# Patient Record
Sex: Female | Born: 1995 | Race: White | Hispanic: No | Marital: Married | Smoking: Never smoker
Health system: Southern US, Community
[De-identification: ages and names within clinical notes are randomized; demographics above are authoritative.]

## PROBLEM LIST (undated history)

## (undated) HISTORY — PX: FRACTURE SURGERY: SHX138

---

## 2017-03-27 DIAGNOSIS — M9902 Segmental and somatic dysfunction of thoracic region: Secondary | ICD-10-CM | POA: Diagnosis not present

## 2017-03-27 DIAGNOSIS — M9901 Segmental and somatic dysfunction of cervical region: Secondary | ICD-10-CM | POA: Diagnosis not present

## 2017-03-27 DIAGNOSIS — M9903 Segmental and somatic dysfunction of lumbar region: Secondary | ICD-10-CM | POA: Diagnosis not present

## 2017-03-27 DIAGNOSIS — M5412 Radiculopathy, cervical region: Secondary | ICD-10-CM | POA: Diagnosis not present

## 2017-03-28 DIAGNOSIS — M9901 Segmental and somatic dysfunction of cervical region: Secondary | ICD-10-CM | POA: Diagnosis not present

## 2017-03-28 DIAGNOSIS — M9903 Segmental and somatic dysfunction of lumbar region: Secondary | ICD-10-CM | POA: Diagnosis not present

## 2017-03-28 DIAGNOSIS — M5412 Radiculopathy, cervical region: Secondary | ICD-10-CM | POA: Diagnosis not present

## 2017-03-28 DIAGNOSIS — M9902 Segmental and somatic dysfunction of thoracic region: Secondary | ICD-10-CM | POA: Diagnosis not present

## 2017-03-29 DIAGNOSIS — M5412 Radiculopathy, cervical region: Secondary | ICD-10-CM | POA: Diagnosis not present

## 2017-03-29 DIAGNOSIS — M9902 Segmental and somatic dysfunction of thoracic region: Secondary | ICD-10-CM | POA: Diagnosis not present

## 2017-03-29 DIAGNOSIS — M9901 Segmental and somatic dysfunction of cervical region: Secondary | ICD-10-CM | POA: Diagnosis not present

## 2017-03-29 DIAGNOSIS — M9903 Segmental and somatic dysfunction of lumbar region: Secondary | ICD-10-CM | POA: Diagnosis not present

## 2017-07-09 ENCOUNTER — Encounter (INDEPENDENT_AMBULATORY_CARE_PROVIDER_SITE_OTHER): Payer: Self-pay

## 2017-07-09 ENCOUNTER — Encounter: Payer: Self-pay | Admitting: Family Medicine

## 2017-07-09 ENCOUNTER — Ambulatory Visit: Payer: BLUE CROSS/BLUE SHIELD | Admitting: Family Medicine

## 2017-07-09 VITALS — BP 108/68 | HR 62 | Ht 63.0 in | Wt 113.0 lb

## 2017-07-09 DIAGNOSIS — N941 Unspecified dyspareunia: Secondary | ICD-10-CM | POA: Diagnosis not present

## 2017-07-09 DIAGNOSIS — M9903 Segmental and somatic dysfunction of lumbar region: Secondary | ICD-10-CM | POA: Diagnosis not present

## 2017-07-09 DIAGNOSIS — Z23 Encounter for immunization: Secondary | ICD-10-CM | POA: Diagnosis not present

## 2017-07-09 DIAGNOSIS — M9902 Segmental and somatic dysfunction of thoracic region: Secondary | ICD-10-CM | POA: Diagnosis not present

## 2017-07-09 DIAGNOSIS — Z30011 Encounter for initial prescription of contraceptive pills: Secondary | ICD-10-CM

## 2017-07-09 DIAGNOSIS — M9901 Segmental and somatic dysfunction of cervical region: Secondary | ICD-10-CM | POA: Diagnosis not present

## 2017-07-09 DIAGNOSIS — M5412 Radiculopathy, cervical region: Secondary | ICD-10-CM | POA: Diagnosis not present

## 2017-07-09 MED ORDER — NORGESTIMATE-ETH ESTRADIOL 0.25-35 MG-MCG PO TABS: 1.0000 | ORAL_TABLET | Freq: Every day | ORAL | 11 refills | Status: DC

## 2017-07-09 MED ORDER — LIDOCAINE 5 % EX OINT: 1.0000 "application " | TOPICAL_OINTMENT | CUTANEOUS | 1 refills | Status: AC | PRN

## 2017-07-09 NOTE — Patient Instructions (Signed)
Thank you for coming in today. Get me the labs from the life insurance.  Start sprintec birth control.  Use the lidocaine ointment on the outside and little on the inside before sex to help with pain.  Remember to go slowly and use lots of water based lube.  The birth control should be effective in a few days.   We should be a pap smear around 22 years old.   We should get records from your pediatric about vaccines.

## 2017-07-09 NOTE — Progress Notes (Signed)
Brittney Lozano is a 22 y.o. female who presents to [facility]: Primary Care Sports Medicine today for establish care.   Brittney Lozano is a healthy 22 year old woman who does not have any known medical problems.  She is establishing care today for some base line screening tests for her family history of heart disease and diabetes and thyroid disease.  Additionally she is interested in birth control options and notes that she has some pain when she has sex.  Family history is pertinent for early cardiac disease, diabetes, and thyroid disease.  She has a scattered health history across multiple states in her youth she is not entirely sure about her vaccine history.  She is interested in tetanus vaccine today but not interested in influenza vaccine.  She notes that she had labs recently as part of her life insurance application thinks that she already has had HIV screening and cholesterol checked.   Pregnancy prevention: Brittney Lozano is recently married to her husband.  They work together on a horse farm.  She does not want to become pregnant.  She is currently using condoms to prevent pregnancy.  She is very anxious about accidentally becoming pregnant.  She has done some research and reading on her birth control options and would like to use the birth control pill at this time.  She is confident that she can take it regularly.  Painful Sex: Brittney Lozano notes that she tends to have pain at the beginning and during sex.  She has tried using lubrication which helps a little.  She denies any fevers or chills vomiting or diarrhea or vaginal discharge.  History reviewed. No pertinent past medical history. History reviewed. No pertinent surgical history. Social History   Tobacco Use  . Smoking status: Never Smoker  . Smokeless tobacco: Never Used  Substance Use Topics  . Alcohol use: No    Frequency: Never   family history  includes Diabetes in her brother; Heart disease in her father and mother; Hypertension in her mother.  ROS as above: No headache, visual changes, nausea, vomiting, diarrhea, constipation, dizziness, abdominal pain, skin rash, fevers, chills, night sweats, weight loss, swollen lymph nodes, body aches, joint swelling, muscle aches, chest pain, shortness of breath, mood changes, visual or auditory hallucinations.    Medications: Current Outpatient Medications  Medication Sig Dispense Refill  . lidocaine (XYLOCAINE) 5 % ointment Apply 1 application topically as needed. 50 g 1  . norgestimate-ethinyl estradiol (ORTHO-CYCLEN,SPRINTEC,PREVIFEM) 0.25-35 MG-MCG tablet Take 1 tablet by mouth daily. Use as directed. 1 Package 11   No current facility-administered medications for this visit.    No Known Allergies  Health Maintenance Health Maintenance  Topic Date Due  . CHLAMYDIA SCREENING  11/27/2011  . HIV Screening  11/27/2011  . TETANUS/TDAP  11/27/2015  . INFLUENZA VACCINE  03/11/2018 (Originally 01/31/2017)     Exam:  BP 108/68   Pulse 62   Ht 5\' 3"  (1.6 m)   Wt 113 lb (51.3 kg)   BMI 20.02 kg/m  Gen: Well NAD HEENT: EOMI,  MMM Lungs: Normal work of breathing. CTABL Heart: RRR no MRG Abd: NABS, Soft. Nondistended, Nontender Exts: Brisk capillary refill, warm and well perfused.    No results found for this or any previous visit (from the past 72 hour(s)). No results found.    Assessment and Plan: 22 y.o. female with  Dyspareunia: Likely spasm in the muscles surrounding the introitus during sex.  I spent a great  deal of time discussing with Brittney Lozano and her husband about the importance of using lubrication has been slow and communicating well during sex.  Additionally we discussed using a little bit of lidocaine ointment of sex to help with sensitivity and spasm.  I noted that sex therapy should be very helpful in symptoms that sex is usually avoidable and sometimes she does not  have to live with.   Contraception: Discussed options.  Plan for Sprintec birth control pills.  Condoms for backup are reasonable.   Screening: We will get labs from recent physical.  Will add TSH lipids metabolic panel as needed. Pap smear due at 22 years old.  Tdap given today prior to discharge.   Orders Placed This Encounter  Procedures  . Tdap vaccine greater than or equal to 7yo IM   Meds ordered this encounter  Medications  . norgestimate-ethinyl estradiol (ORTHO-CYCLEN,SPRINTEC,PREVIFEM) 0.25-35 MG-MCG tablet    Sig: Take 1 tablet by mouth daily. Use as directed.    Dispense:  1 Package    Refill:  11  . lidocaine (XYLOCAINE) 5 % ointment    Sig: Apply 1 application topically as needed.    Dispense:  50 g    Refill:  1     Discussed warning signs or symptoms. Please see discharge instructions. Patient expresses understanding.

## 2017-07-19 ENCOUNTER — Telehealth: Payer: Self-pay

## 2017-07-19 NOTE — Telephone Encounter (Signed)
Patient called stated that after 2 weeks of starting sprintec she has been experiencing jittery and heart racing. She wants to know if this is to expected with this medication or should she come in. Please advise. Brittney Lozano,CMA

## 2017-07-20 NOTE — Telephone Encounter (Signed)
Those are not expected symptoms of birth control pills. We can investigate it further. Follow up with me or if I cant get you in (I am out of town starting Wednesday) my partners can see you.

## 2017-07-20 NOTE — Telephone Encounter (Signed)
Called patient to give advise but was unable to reach the patient. I will try back later. Rhonda Cunningham,CMA

## 2017-07-23 NOTE — Telephone Encounter (Signed)
Called patient  And she stated that she is not currently having those symptoms and she is still taking the pills but advised patient if symptoms do come back to call the office and schedule an appointment. Rhonda Cunningham,CMA

## 2017-10-09 DIAGNOSIS — M5412 Radiculopathy, cervical region: Secondary | ICD-10-CM | POA: Diagnosis not present

## 2017-10-09 DIAGNOSIS — M9903 Segmental and somatic dysfunction of lumbar region: Secondary | ICD-10-CM | POA: Diagnosis not present

## 2017-10-09 DIAGNOSIS — M9902 Segmental and somatic dysfunction of thoracic region: Secondary | ICD-10-CM | POA: Diagnosis not present

## 2017-10-09 DIAGNOSIS — M9901 Segmental and somatic dysfunction of cervical region: Secondary | ICD-10-CM | POA: Diagnosis not present

## 2017-11-06 DIAGNOSIS — M9902 Segmental and somatic dysfunction of thoracic region: Secondary | ICD-10-CM | POA: Diagnosis not present

## 2017-11-06 DIAGNOSIS — M9901 Segmental and somatic dysfunction of cervical region: Secondary | ICD-10-CM | POA: Diagnosis not present

## 2017-11-06 DIAGNOSIS — M5412 Radiculopathy, cervical region: Secondary | ICD-10-CM | POA: Diagnosis not present

## 2017-11-06 DIAGNOSIS — M9903 Segmental and somatic dysfunction of lumbar region: Secondary | ICD-10-CM | POA: Diagnosis not present

## 2017-12-11 ENCOUNTER — Encounter: Payer: Self-pay | Admitting: Family Medicine

## 2017-12-11 ENCOUNTER — Ambulatory Visit: Payer: BLUE CROSS/BLUE SHIELD | Admitting: Family Medicine

## 2017-12-11 ENCOUNTER — Ambulatory Visit (INDEPENDENT_AMBULATORY_CARE_PROVIDER_SITE_OTHER): Payer: BLUE CROSS/BLUE SHIELD

## 2017-12-11 VITALS — BP 91/60 | HR 60 | Wt 117.0 lb

## 2017-12-11 DIAGNOSIS — M7989 Other specified soft tissue disorders: Secondary | ICD-10-CM | POA: Diagnosis not present

## 2017-12-11 DIAGNOSIS — S59911A Unspecified injury of right forearm, initial encounter: Secondary | ICD-10-CM | POA: Diagnosis not present

## 2017-12-11 DIAGNOSIS — W5512XA Struck by horse, initial encounter: Secondary | ICD-10-CM

## 2017-12-11 DIAGNOSIS — M79631 Pain in right forearm: Secondary | ICD-10-CM

## 2017-12-11 NOTE — Patient Instructions (Signed)
Thank you for coming in today. Keep working on stretching.  Ok to ride if feeling ok with the brace.  Use the brace as needed.  Compression is ok.  Keep an eye on pain and numbness to the top of the hand.    Acute Compartment Syndrome Compartment syndrome is a painful condition that occurs when swelling and pressure build up in a body space (compartment) of the arms or legs. Groups of muscles, nerves, and blood vessels in the arms and legs are separated into various compartments. Each compartment is surrounded by tough layers of tissue (fascia). In compartment syndrome, pressure builds up within the layers of fascia and begins to push on the structures within that compartment. In acute compartment syndrome, the pressure builds up suddenly, often as the result of an injury. If pressure continues to increase, it can block the flow of blood in the smallest blood vessels (capillaries). Then the muscles in the compartment cannot get enough oxygen and nutrients and will start to die within 4-6 hours. The nerves will begin to die within 12-24 hours. This condition is a medical emergency that must be treated with surgery. What are the causes? This condition may be caused by:  Injury. Some injuries can cause swelling or bleeding in a compartment. This can lead to compartment syndrome. Injuries that may cause this problem include: ? Broken bones, especially the long bones of the arms and legs. ? Crushing injuries. ? Penetrating injuries, such as a knife wound. ? Badly bruised muscles. ? Poisonous bites, such as a snake bite. ? Severe burns.  Blocked blood flow. This could be a result of: ? A cast or bandage that is too tight. ? A surgical procedure. Blood flow sometimes has to be stopped for a while during a surgery, usually with a tourniquet. ? Lying for too long in a position that restricts blood flow. This can happen in people who have nerve damage or if a person is unconscious for a long  time. ? Medicines used to build up muscles (anabolic steroids). ? Medicines that keep the blood from forming clots (blood thinners).  What are the signs or symptoms? The most common symptom of this condition is pain. The pain:  May be far more severe than it should be for the injury you have.  May get worse: ? When moving or stretching the affected body part. ? When the area is pushed or squeezed. ? When raising (elevating) affected body part above the level of the heart.  May come with a feeling of tingling or burning.  May not get better when you take pain medicine.  Other symptoms include:  A feeling of tightness or fullness in the affected area.  A loss of feeling.  Weakness in the area.  Loss of movement.  Skin becoming pale, tight, and shiny over the painful area.  Warmth and tenderness.  Tensing when the affected area is touched.  How is this diagnosed? This condition may be diagnosed based on:  Your physical exam and symptoms.  Measuring the pressure in the affected area (compartment pressure measurement).  Tests to rule out other problems, such as: ? X-rays. ? Blood tests. ? Ultrasound.  How is this treated? Treatment for this condition uses a procedure called fasciotomy. In this procedure, incisions are made through the fascia to relieve the pressure in the compartment and to prevent permanent damage. Before the surgery, first-aid treatment is done, which may include:  Treating any injury.  Loosening or removing any  cast, bandage, or external wrap that may be causing pain.  Elevating the painful arm or leg to the same level as the heart.  Giving oxygen.  Giving fluids through an IV tube.  Pain medicine.  Summary  Compartment syndrome occurs when swelling and pressure build up in a body space (compartment) of the arms or legs.  First aid treatment may include loosening or removing a cast, bandage, or wrap and elevating the painful arm or leg  at the level of the heart.  In acute compartment syndrome, the pressure builds up suddenly, often as the result of an injury.  This condition is a medical emergency that must be treated with a surgical procedure called fasciotomy. This procedure relieves the pressure and prevents permanent damage. This information is not intended to replace advice given to you by your health care provider. Make sure you discuss any questions you have with your health care provider. Document Released: 06/07/2009 Document Revised: 06/08/2016 Document Reviewed: 06/08/2016 Elsevier Interactive Patient Education  2017 [employer]

## 2017-12-11 NOTE — Progress Notes (Signed)
Brittney DuffKatlyn Lozano is a 22 y.o. female who presents to [facility] today for right forearm pain.   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 She notes pain and bruising and swelling in the right dorsal to ulnar forearm. She notes some mild numbness and tingling to the fifth digit and the ulnar side of the fourth digit.  She denies any weakness or numbness.  She notes pain is present with wrist and finger extension.  She denies any weakness.  She is tried some ibuprofen which does help some.  She has a bit of a racing event this weekend and a very important bill racing event next week and that she would like to attend if possible.  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    ROS:  As above  Exam:  BP 91/60   Pulse 60   Wt 117 lb (53.1 kg)   BMI 20.73 kg/m  General: Well Developed, well nourished, and in no acute distress.  Neuro/Psych: Alert and oriented x3, extra-ocular muscles intact, able to move all 4 extremities, sensation grossly intact. Skin: Warm and dry, no rashes noted.  Respiratory: Not using accessory muscles, speaking in full sentences, trachea midline.  Cardiovascular: Pulses palpable, no extremity edema. Abdomen: Does not appear distended. MSK:  Left forearm large crescent-shaped contusion across the dorsal ulnar forearm.  This is associated with forearm swelling.  Region is tender to palpation. Elbow motion is intact. Wrist motion is intact but pain with wrist flexion and resisted wrist extension and finger extension Pulses capillary refill and sensation or strength. Hand strength is intact to finger extension flexion abduction and adduction.    Lab and Radiology Results X-ray images right forearm personally independent reviewed. No fractures present.  No acute  changes. Awaiting formal radiology review.  Assessment and Plan: 22 y.o. female with  Right forearm contusion.  No fractures.  No obvious severe injury.  Acute compartment syndrome is extremely unlikely as patient has intact sensation pulses capillary refill and strength. She does have symptoms of contusion to the extensor muscle groups in her forearm.  Plan to use a wrist brace as needed for comfort as well as working on wrist flexion activities. The subjective numbness and tingling to the fifth and fourth digit are very likely ulnar nerve irritation due to swelling.  This should improve his symptoms improve. Additionally will discuss possibility of developing compartment syndrome and discussed warning signs or symptoms.  I think compartment syndrome is very unlikely as patient does not have symptoms in the nerve distribution associated with dorsal compartment syndrome.  Tdap up to date.   Orders Placed This Encounter  Procedures  . DG Forearm Right    Standing Status:   Future    Number of Occurrences:   1    Standing Expiration Date:   02/11/2019    Order Specific Question:   Reason for Exam (SYMPTOM  OR DIAGNOSIS REQUIRED)    Answer:   kicked by horse pain in forearm    Order Specific Question:   Is patient pregnant?    Answer:   No    Order Specific Question:   Preferred imaging location?    [facility]    Order Specific Question:   Radiology Contrast Protocol - do NOT  remove file path    Answer:   [email or web address]   No orders of the defined types were placed in this encounter.   Historical information moved to improve visibility of documentation.  History reviewed. No pertinent past medical history. History reviewed. No pertinent surgical history. Social History   Tobacco Use  . Smoking status: Never Smoker  . Smokeless tobacco: Never Used  Substance Use Topics  . Alcohol use: No    Frequency: Never   family  history includes Diabetes in her brother; Heart disease in her father and mother; Hypertension in her mother.  Medications: Current Outpatient Medications  Medication Sig Dispense Refill  . lidocaine (XYLOCAINE) 5 % ointment Apply 1 application topically as needed. 50 g 1  . norgestimate-ethinyl estradiol (ORTHO-CYCLEN,SPRINTEC,PREVIFEM) 0.25-35 MG-MCG tablet Take 1 tablet by mouth daily. Use as directed. 1 Package 11   No current facility-administered medications for this visit.    No Known Allergies    Discussed warning signs or symptoms. Please see discharge instructions. Patient expresses understanding.

## 2018-03-01 ENCOUNTER — Telehealth: Payer: Self-pay | Admitting: Family Medicine

## 2018-03-01 NOTE — Telephone Encounter (Signed)
You are overdue for cervical cancer and chlamydia screening.  This is part of typical well woman care.  I can do these tests however if you would like a woman to do them we can arrange for Dr. Sunnie NielsenNatalie Alexander to do the Pap smear.  If you want an OB/GYN provider to do one I can refer you to OB/GYN.  Please let me know what you would like

## 2018-03-01 NOTE — Telephone Encounter (Signed)
Please schedule

## 2018-03-05 NOTE — Telephone Encounter (Signed)
Unable to get to this on Friday, can you please call and try to schedule pt?  Thanks!

## 2018-03-07 NOTE — Telephone Encounter (Signed)
I spoke to patient, she's at the vet right now with her horse and will call us back when she's done with that appointment.

## 2018-03-19 ENCOUNTER — Emergency Department (HOSPITAL_COMMUNITY)
Admission: EM | Admit: 2018-03-19 | Discharge: 2018-03-19 | Disposition: A | Discharge status: Home / Self Care | Payer: BLUE CROSS/BLUE SHIELD | Attending: Emergency Medicine | Admitting: Emergency Medicine

## 2018-03-19 ENCOUNTER — Other Ambulatory Visit: Payer: Self-pay

## 2018-03-19 ENCOUNTER — Encounter (HOSPITAL_COMMUNITY): Payer: Self-pay | Admitting: Emergency Medicine

## 2018-03-19 ENCOUNTER — Emergency Department (HOSPITAL_COMMUNITY): Payer: BLUE CROSS/BLUE SHIELD

## 2018-03-19 DIAGNOSIS — R55 Syncope and collapse: Secondary | ICD-10-CM | POA: Diagnosis not present

## 2018-03-19 DIAGNOSIS — Z3202 Encounter for pregnancy test, result negative: Secondary | ICD-10-CM | POA: Diagnosis not present

## 2018-03-19 DIAGNOSIS — R000 Tachycardia, unspecified: Secondary | ICD-10-CM | POA: Diagnosis not present

## 2018-03-19 DIAGNOSIS — R202 Paresthesia of skin: Secondary | ICD-10-CM | POA: Diagnosis not present

## 2018-03-19 DIAGNOSIS — R0689 Other abnormalities of breathing: Secondary | ICD-10-CM | POA: Diagnosis not present

## 2018-03-19 LAB — COMPREHENSIVE METABOLIC PANEL
ALT: 13 U/L (ref 0–44)
ANION GAP: 7 (ref 5–15)
AST: 17 U/L (ref 15–41)
Albumin: 3.9 g/dL (ref 3.5–5.0)
Alkaline Phosphatase: 39 U/L (ref 38–126)
BUN: 14 mg/dL (ref 6–20)
CO2: 22 mmol/L (ref 22–32)
CREATININE: 0.97 mg/dL (ref 0.44–1.00)
Calcium: 8.7 mg/dL — ABNORMAL LOW (ref 8.9–10.3)
Chloride: 108 mmol/L (ref 98–111)
Glucose, Bld: 106 mg/dL — ABNORMAL HIGH (ref 70–99)
Potassium: 3.6 mmol/L (ref 3.5–5.1)
SODIUM: 137 mmol/L (ref 135–145)
Total Bilirubin: 0.5 mg/dL (ref 0.3–1.2)
Total Protein: 7 g/dL (ref 6.5–8.1)

## 2018-03-19 LAB — URINALYSIS, ROUTINE W REFLEX MICROSCOPIC
BACTERIA UA: NONE SEEN
Bilirubin Urine: NEGATIVE
GLUCOSE, UA: NEGATIVE mg/dL
Hgb urine dipstick: NEGATIVE
KETONES UR: NEGATIVE mg/dL
Leukocytes, UA: NEGATIVE
NITRITE: NEGATIVE
PROTEIN: 30 mg/dL — AB
Specific Gravity, Urine: 1.023 (ref 1.005–1.030)
pH: 9 — ABNORMAL HIGH (ref 5.0–8.0)

## 2018-03-19 LAB — CBC
HCT: 36 % (ref 36.0–46.0)
Hemoglobin: 12.1 g/dL (ref 12.0–15.0)
MCH: 32.1 pg (ref 26.0–34.0)
MCHC: 33.6 g/dL (ref 30.0–36.0)
MCV: 95.5 fL (ref 78.0–100.0)
Platelets: 177 10*3/uL (ref 150–400)
RBC: 3.77 MIL/uL — AB (ref 3.87–5.11)
RDW: 11.6 % (ref 11.5–15.5)
WBC: 7.6 10*3/uL (ref 4.0–10.5)

## 2018-03-19 LAB — PREGNANCY, URINE: Preg Test, Ur: NEGATIVE

## 2018-03-19 LAB — CBG MONITORING, ED: GLUCOSE-CAPILLARY: 90 mg/dL (ref 70–99)

## 2018-03-19 MED ORDER — SODIUM CHLORIDE 0.9 % IV BOLUS
1000.0000 mL | Freq: Once | INTRAVENOUS | Status: AC
  Administered 2018-03-19: 1000 mL via INTRAVENOUS

## 2018-03-19 NOTE — ED Notes (Signed)
Patient stated she doesn't eat because she's scared she will gain weight. Educated patient on importance of eating and drinking appropriately.
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
## 2018-03-19 NOTE — ED Provider Notes (Signed)
[facility] EMERGENCY DEPARTMENT Provider Note   CSN: [id number] Arrival date & time: 03/19/18  2003     History   Chief Complaint Chief Complaint  Patient presents with  . Loss of Consciousness    HPI Brittney Lozano is a 22 y.o. female.  HPI  22 year old female, she presents by ambulance after having what appeared to be a syncopal episode of sorts.  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  She has had an episode this evening where she was becoming lightheaded, she went up to the house to get something to drink, and was very little amount of Dr. Reino KentPepper, went back down to the arena where she was helping with riding lessons, it finished, she was helping the person get off the horse, she became even more lightheaded and her husband was at the side, she is somewhat collapsed to her knees and appeared to be passing out, he helped to get her back up to the house on the ATV, called 911 for assistance.  At this time she was complaining of things such as being unable to speak when she wanted to speak, having some left-sided numbness of her arm and her leg, this all resolved very quickly in the ambulance.  The patient states that she has not had headaches, she recently had eyeglasses that were fixed for her and this is eliminated her headaches.  There is no family history of multiple sclerosis, the patient does report having some intermittent tingling of her right hand but there is only after having her elbow injured.  No other complaints including chest pain shortness of breath fevers or chills but she does have some frequent urination, unsure if she is pregnant.  History reviewed. No pertinent past medical history.  There are no active problems to display for this patient.   Past Surgical History:  Procedure Laterality Date  .  FRACTURE SURGERY       OB History   None      Home Medications    Prior to Admission medications   Medication Sig Start Date End Date Taking? Authorizing Provider  lidocaine (XYLOCAINE) 5 % ointment Apply 1 application topically as needed. 07/09/17   Rodolph Bongorey, Evan S, MD  norgestimate-ethinyl estradiol (ORTHO-CYCLEN,SPRINTEC,PREVIFEM) 0.25-35 MG-MCG tablet Take 1 tablet by mouth daily. Use as directed. 07/09/17   Rodolph Bongorey, Evan S, MD    Family History Family History  Problem Relation Age of Onset  . Hypertension Mother   . Heart disease Mother   . Heart disease Father   . Diabetes Brother     Social History Social History   Tobacco Use  . Smoking status: Never Smoker  . Smokeless tobacco: Never Used  Substance Use Topics  . Alcohol use: No    Frequency: Never  . Drug use: No     Allergies   Patient has no known allergies.   Review of Systems Review of Systems  All other systems reviewed and are negative.    Physical Exam Updated Vital Signs BP 106/71   Pulse 66   Temp (!) 97.4 F (36.3 C) (Oral)   Resp 15   LMP 03/14/2018   SpO2 100%   Physical Exam  Constitutional: She appears well-developed and well-nourished. No distress.  HENT:  Head: Normocephalic and atraumatic.  Mouth/Throat: Oropharynx is  clear and moist. No oropharyngeal exudate.  Eyes: Pupils are equal, round, and reactive to light. Conjunctivae and EOM are normal. Right eye exhibits no discharge. Left eye exhibits no discharge. No scleral icterus.  Neck: Normal range of motion. Neck supple. No JVD present. No thyromegaly present.  Cardiovascular: Normal rate, regular rhythm, normal heart sounds and intact distal pulses. Exam reveals no gallop and no friction rub.  No murmur heard. Pulmonary/Chest: Effort normal and breath sounds normal. No respiratory distress. She has no wheezes. She has no rales.  Abdominal: Soft. Bowel sounds are normal. She exhibits no distension and no mass. There is no  tenderness.  Musculoskeletal: Normal range of motion. She exhibits no edema or tenderness.  Lymphadenopathy:    She has no cervical adenopathy.  Neurological: She is alert. Coordination normal.  The patient has normal speech, normal facial symmetry, normal sensation to light touch of her face arms and legs, normal coordination by finger-nose-finger, normal strength in all 4 extremities as tested by grip and straight leg raise.  Memory is intact, speech is clear.  Alternating movements are normal, no pronator drift  Skin: Skin is warm and dry. No rash noted. No erythema.  Psychiatric: She has a normal mood and affect. Her behavior is normal.  Nursing note and vitals reviewed.    ED Treatments / Results  Labs (all labs ordered are listed, but only abnormal results are displayed) Labs Reviewed  COMPREHENSIVE METABOLIC PANEL - Abnormal; Notable for the following components:      Result Value   Glucose, Bld 106 (*)    Calcium 8.7 (*)    All other components within normal limits  URINALYSIS, ROUTINE W REFLEX MICROSCOPIC - Abnormal; Notable for the following components:   APPearance HAZY (*)    pH 9.0 (*)    Protein, ur 30 (*)    All other components within normal limits  CBC - Abnormal; Notable for the following components:   RBC 3.77 (*)    All other components within normal limits  PREGNANCY, URINE  CBG MONITORING, ED    EKG EKG Interpretation  Date/Time:  Tuesday March 19 2018 20:39:10 EDT Ventricular Rate:  71 PR Interval:    QRS Duration: 90 QT Interval:  429 QTC Calculation: 467 R Axis:   79 Text Interpretation:  Sinus rhythm Normal ECG No old tracing to compare Confirmed by Eber Hong ([id number]) on 03/19/2018 8:43:00 PM   Radiology Ct Head Wo Contrast  Result Date: 03/19/2018 CLINICAL DATA:  LOC, syncope EXAM: CT HEAD WITHOUT CONTRAST TECHNIQUE: Contiguous axial images were obtained from the base of the skull through the vertex without intravenous contrast.  COMPARISON:  None. FINDINGS: Brain: No evidence of acute infarction, hemorrhage, hydrocephalus, extra-axial collection or mass lesion/mass effect. Vascular: No hyperdense vessel or unexpected calcification. Skull: Normal. Negative for fracture or focal lesion. Sinuses/Orbits: No acute finding. Other: None IMPRESSION: Negative non contrasted CT appearance of the brain Electronically Signed   By: Jasmine Pang M.D.   On: 03/19/2018 21:23    Procedures Procedures (including critical care time)  Medications Ordered in ED Medications  sodium chloride 0.9 % bolus 1,000 mL (1,000 mLs Intravenous New Bag/Given 03/19/18 2052)     Initial Impression / Assessment and Plan / ED Course  I have reviewed the triage vital signs and the nursing notes.  Pertinent labs & imaging results that were available during my care of the patient were reviewed by me and considered in my medical decision making (see chart for  details).  Clinical Course as of Mar 19 2150  Tue Mar 19, 2018  2147 The patient does have some proteinuria, she has no signs of urinary infection, she is not pregnant, metabolic panel is unremarkable and CBC is normal.  The patient has a normal CT scan of the brain, her EKG is unremarkable with no signs of accessory pathways, no signs of Brugada, the patient has been given these results, she has been given IV fluids and is now feeling much better, she is stable for discharge with normal vital signs.   [BM]    Clinical Course User Index [BM] Eber Hong, MD    The patient's exam is unremarkable including her cardiac and neurologic exams.  At this time she will need some labs to further evaluate the source of her symptoms including a urinalysis, pregnancy test, metabolic panel, CBC to rule out things such as anemia or renal dysfunction dehydration electro light abnormalities or infection.  EKG will be ordered to rule out arrhythmia or other signs of predisposing cardiac dysrhythmia such as WPW or  Brugada.  Vitals:   03/19/18 2004 03/19/18 2100 03/19/18 2130 03/19/18 2145  BP: 110/70 111/76 106/71   Pulse: 69 71 72 66  Resp: 18 17 (!) 21 15  Temp: (!) 97.4 F (36.3 C)     TempSrc: Oral     SpO2: 100% 100% 100% 100%     Final Clinical Impressions(s) / ED Diagnoses   Final diagnoses:  Syncope and collapse    ED Discharge Orders    None       Eber Hong, MD 03/19/18 2151

## 2018-03-19 NOTE — Discharge Instructions (Signed)
Your testing shows that you are dehydrated You do not have any lab problems - your blood counts are normal, your kidneys are normal Your brain looks Great on the CT scan and your heart has a normal EKG Your testing today shows that the reason that you have had this episode tonight of weakness / numbness / etc is likely from lack of nutrition and dehydration.   Please make a concerted effort to have multiple small meals a day, you should force herself to eat at least 1500 cal/day, drinking plenty of liquids and keeping your urine a nice clear watery appearance.  If you should develop severe or worsening symptoms please seek medical exam.  If you continue to have difficulty with weakness numbness or tingling he will need further evaluation for other causes including an MRI of the brain however at this time we do not need that test.

## 2018-03-21 ENCOUNTER — Ambulatory Visit: Payer: BLUE CROSS/BLUE SHIELD | Admitting: Family Medicine

## 2018-03-21 ENCOUNTER — Encounter: Payer: Self-pay | Admitting: Family Medicine

## 2018-03-21 VITALS — BP 112/75 | HR 54 | Ht 63.0 in | Wt 121.0 lb

## 2018-03-21 DIAGNOSIS — I951 Orthostatic hypotension: Secondary | ICD-10-CM | POA: Diagnosis not present

## 2018-03-21 NOTE — Patient Instructions (Addendum)
Thank you for coming in today. Keep hydrated with exercise.  Continue to eat around 1500 calories a day more with activity.  Eat good quality food.  Get some salt during the day.  Recheck in about 1 month.  In that 1 month follow up we will recheck urine and we can get blood.     Orthostatic Hypotension Orthostatic hypotension is a sudden drop in blood pressure that happens when you quickly change positions, such as when you get up from a seated or lying position. Blood pressure is a measurement of how strongly, or weakly, your blood is pressing against the walls of your arteries. Arteries are blood vessels that carry blood from your heart throughout your body. When blood pressure is too low, you may not get enough blood to your brain or to the rest of your organs. This can cause weakness, light-headedness, rapid heartbeat, and fainting. This can last for just a few seconds or for up to a few minutes. Orthostatic hypotension is usually not a serious problem. However, if it happens frequently or gets worse, it may be a sign of something more serious. What are the causes? This condition may be caused by:  Sudden changes in posture, such as standing up quickly after you have been sitting or lying down.  Blood loss.  Loss of body fluids (dehydration).  Heart problems.  Hormone (endocrine) problems.  Pregnancy.  Severe infection.  Lack of certain nutrients.  Severe allergic reactions (anaphylaxis).  Certain medicines, such as blood pressure medicine or medicines that make the body lose excess fluids (diuretics). Sometimes, this condition can be caused by not taking medicine as directed, such as taking too much of a certain medicine.  What increases the risk? Certain factors can make you more likely to develop orthostatic hypotension, including:  Age. Risk increases as you get older.  Conditions that affect the heart or the central nervous system.  Taking certain medicines, such  as blood pressure medicine or diuretics.  Being pregnant.  What are the signs or symptoms? Symptoms of this condition may include:  Weakness.  Light-headedness.  Dizziness.  Blurred vision.  Fatigue.  Rapid heartbeat.  Fainting, in severe cases.  How is this diagnosed? This condition is diagnosed based on:  Your medical history.  Your symptoms.  Your blood pressure measurement. Your health care provider will check your blood pressure when you are: ? Lying down. ? Sitting. ? Standing.  A blood pressure reading is recorded as two numbers, such as "120 over 80" (or 120/80). The first ("top") number is called the systolic pressure. It is a measure of the pressure in your arteries as your heart beats. The second ("bottom") number is called the diastolic pressure. It is a measure of the pressure in your arteries when your heart relaxes between beats. Blood pressure is measured in a unit called mm Hg. Healthy blood pressure for adults is 120/80. If your blood pressure is below 90/60, you may be diagnosed with hypotension. Other information or tests that may be used to diagnose orthostatic hypotension include:  Your other vital signs, such as your heart rate and temperature.  Blood tests.  Tilt table test. For this test, you will be safely secured to a table that moves you from a lying position to an upright position. Your heart rhythm and blood pressure will be monitored during the test.  How is this treated? Treatment for this condition may include:  Changing your diet. This may involve eating more salt (sodium)  or drinking more water.  Taking medicines to raise your blood pressure.  Changing the dosage of certain medicines you are taking that might be lowering your blood pressure.  Wearing compression stockings. These stockings help to prevent blood clots and reduce swelling in your legs.  In some cases, you may need to go to the hospital for:  Fluid replacement.  This means you will receive fluids through an IV tube.  Blood replacement. This means you will receive donated blood through an IV tube (transfusion).  Treating an infection or heart problems, if this applies.  Monitoring. You may need to be monitored while medicines that you are taking wear off.  Follow these instructions at home: Eating and drinking   Drink enough fluid to keep your urine clear or pale yellow.  Eat a healthy diet and follow instructions from your health care provider about eating or drinking restrictions. A healthy diet includes: ? Fresh fruits and vegetables. ? Whole grains. ? Lean meats. ? Low-fat dairy products.  Eat extra salt only as directed. Do not add extra salt to your diet unless your health care provider told you to do that.  Eat frequent, small meals.  Avoid standing up suddenly after eating. Medicines  Take over-the-counter and prescription medicines only as told by your health care provider. ? Follow instructions from your health care provider about changing the dosage of your current medicines, if this applies. ? Do not stop or adjust any of your medicines on your own. General instructions  Wear compression stockings as told by your health care provider.  Get up slowly from lying down or sitting positions. This gives your blood pressure a chance to adjust.  Avoid hot showers and excessive heat as directed by your health care provider.  Return to your normal activities as told by your health care provider. Ask your health care provider what activities are safe for you.  Do not use any products that contain nicotine or tobacco, such as cigarettes and e-cigarettes. If you need help quitting, ask your health care provider.  Keep all follow-up visits as told by your health care provider. This is important. Contact a health care provider if:  You vomit.  You have diarrhea.  You have a fever for more than 2-3 days.  You feel more thirsty  than usual.  You feel weak and tired. Get help right away if:  You have chest pain.  You have a fast or irregular heartbeat.  You develop numbness in any part of your body.  You cannot move your arms or your legs.  You have trouble speaking.  You become sweaty or feel lightheaded.  You faint.  You feel short of breath.  You have trouble staying awake.  You feel confused. This information is not intended to replace advice given to you by your health care provider. Make sure you discuss any questions you have with your health care provider. Document Released: 06/09/2002 Document Revised: 03/07/2016 Document Reviewed: 12/10/2015 Elsevier Interactive Patient Education  2018 [employer].

## 2018-03-21 NOTE — Progress Notes (Signed)
     Brittney Lozano is a 22 y.o. female who presents to [facility]: Primary Care Sports Medicine today for orthostasis.  Brittney Lozano suffered a syncopal or near syncopal event at work on the 17th.  She was taken to the emergency department where her work-up was largely normal with a normal EKG and relatively normal labs.  Calcium was very slightly low at 8.7.  She was rehydrated and is feeling a lot better now.  She notes that she has been intentionally eating less in an effort to keep her weight down in preparation for an upcoming writing event.  She is a competitive horseback rider.  She denies chest pain palpitations shortness of breath fevers or chills.  She denies any urinary symptoms.  Additionally she notes in the emergency department she had mild proteinuria.  She currently feels well otherwise.   ROS as above:  Exam:  BP 112/75   Pulse (!) 54   Ht 5' 3" (1.6 m)   Wt 121 lb (54.9 kg)   LMP 03/14/2018   BMI 21.43 kg/m  Wt Readings from Last 5 Encounters:  03/21/18 121 lb (54.9 kg)  12/11/17 117 lb (53.1 kg)  07/09/17 113 lb (51.3 kg)    Gen: Well NAD HEENT: EOMI,  MMM Lungs: Normal work of breathing. CTABL Heart: RRR no MRG Abd: NABS, Soft. Nondistended, Nontender Exts: Brisk capillary refill, warm and well perfused.   Lab and Radiology Results Results for orders placed or performed during the hospital encounter of 03/19/18 (from the past 72 hour(s))  Urinalysis, Routine w reflex microscopic     Status: Abnormal   Collection Time: 03/19/18  8:27 PM  Result Value Ref Range   Color, Urine YELLOW YELLOW   APPearance HAZY (A) CLEAR   Specific Gravity, Urine 1.023 1.005 - 1.030   pH 9.0 (H) 5.0 - 8.0   Glucose, UA NEGATIVE NEGATIVE mg/dL   Hgb urine dipstick NEGATIVE NEGATIVE   Bilirubin Urine NEGATIVE NEGATIVE   Ketones, ur NEGATIVE NEGATIVE mg/dL   Protein, ur 30 (A) NEGATIVE mg/dL   Nitrite NEGATIVE NEGATIVE   Leukocytes, UA NEGATIVE NEGATIVE   RBC / HPF 0-5 0 - 5 RBC/hpf   WBC, UA 0-5 0 - 5 WBC/hpf   Bacteria, UA NONE SEEN NONE SEEN   Squamous Epithelial / LPF 0-5 0 - 5   Mucus PRESENT     Comment: Performed at [facility], [address] [place], [place] [address]  Pregnancy, urine     Status: None   Collection Time: 03/19/18  8:27 PM  Result Value Ref Range   Preg Test, Ur NEGATIVE NEGATIVE    Comment:        THE SENSITIVITY OF THIS METHODOLOGY IS >20 mIU/mL. Performed at [facility], [address] [place], [place] [address]   CBG monitoring, ED     Status: None   Collection Time: 03/19/18  8:49 PM  Result Value Ref Range   Glucose-Capillary 90 70 - 99 mg/dL  Comprehensive metabolic panel     Status: Abnormal   Collection Time: 03/19/18  8:50 PM  Result Value Ref Range   Sodium 137 135 - 145 mmol/L   Potassium 3.6 3.5 - 5.1 mmol/L   Chloride 108 98 - 111 mmol/L   CO2 22 22 - 32 mmol/L   Glucose, Bld 106 (H) 70 - 99 mg/dL   BUN 14 6 - 20 mg/dL   Creatinine, Ser 0.97 0.44 - 1.00 mg/dL     Calcium 8.7 (L) 8.9 - 10.3 mg/dL   Total Protein 7.0 6.5 - 8.1 g/dL   Albumin 3.9 3.5 - 5.0 g/dL   AST 17 15 - 41 U/L   ALT 13 0 - 44 U/L   Alkaline Phosphatase 39 38 - 126 U/L   Total Bilirubin 0.5 0.3 - 1.2 mg/dL   GFR calc non Af Amer >60 >60 mL/min   GFR calc Af Amer >60 >60 mL/min    Comment: (NOTE) The eGFR has been calculated using the CKD EPI equation. This calculation has not been validated in all clinical situations. eGFR's persistently <60 mL/min signify possible Chronic Kidney Disease.    Anion gap 7 5 - 15    Comment: Performed at [facility], [address], [place], [place] [address]  CBC     Status: Abnormal   Collection Time: 03/19/18  8:50 PM  Result Value Ref Range   WBC 7.6 4.0 - 10.5 K/uL   RBC 3.77 (L) 3.87 - 5.11 MIL/uL   Hemoglobin 12.1 12.0 - 15.0 g/dL   HCT 36.0 36.0 - 46.0 %   MCV 95.5 78.0 - 100.0 fL   MCH 32.1 26.0 - 34.0 pg    MCHC 33.6 30.0 - 36.0 g/dL   RDW 11.6 11.5 - 15.5 %   Platelets 177 150 - 400 K/uL    Comment: Performed at [facility], [address], [place], [place] [address]   Ct Head Wo Contrast  Result Date: 03/19/2018 CLINICAL DATA:  LOC, syncope EXAM: CT HEAD WITHOUT CONTRAST TECHNIQUE: Contiguous axial images were obtained from the base of the skull through the vertex without intravenous contrast. COMPARISON:  None. FINDINGS: Brain: No evidence of acute infarction, hemorrhage, hydrocephalus, extra-axial collection or mass lesion/mass effect. Vascular: No hyperdense vessel or unexpected calcification. Skull: Normal. Negative for fracture or focal lesion. Sinuses/Orbits: No acute finding. Other: None IMPRESSION: Negative non contrasted CT appearance of the brain Electronically Signed   By: Donavan Foil M.D.   On: 03/19/2018 21:23      Assessment and Plan: 22 y.o. female with  Orthostasis due to dehydration and decreased p.o. intake.  Lengthy discussion about the importance of good hydration especially during the heat.  Additionally discussed calorie goals and needs and recommend against fasting.  Calculated calories recommend about 1500 cal/day.  We will recheck in about a month.  At that time we will likely recheck urinalysis and recheck metabolic panel to reassess calcium.   I spent 25 minutes with this patient, greater than 50% was face-to-face time counseling regarding ddx and plan and as above.       Historical information moved to improve visibility of documentation.  History reviewed. No pertinent past medical history. Past Surgical History:  Procedure Laterality Date  . FRACTURE SURGERY     Social History   Tobacco Use  . Smoking status: Never Smoker  . Smokeless tobacco: Never Used  Substance Use Topics  . Alcohol use: No    Frequency: Never   family history includes Diabetes in her brother; Heart disease in her father and mother; Hypertension in her  mother.  Medications: Current Outpatient Medications  Medication Sig Dispense Refill  . lidocaine (XYLOCAINE) 5 % ointment Apply 1 application topically as needed. 50 g 1  . norgestimate-ethinyl estradiol (ORTHO-CYCLEN,SPRINTEC,PREVIFEM) 0.25-35 MG-MCG tablet Take 1 tablet by mouth daily. Use as directed. 1 Package 11   No current facility-administered medications for this visit.    No Known Allergies   Discussed warning signs  or symptoms. Please see discharge instructions. Patient expresses understanding.

## 2018-03-25 DIAGNOSIS — M9901 Segmental and somatic dysfunction of cervical region: Secondary | ICD-10-CM | POA: Diagnosis not present

## 2018-03-25 DIAGNOSIS — M9902 Segmental and somatic dysfunction of thoracic region: Secondary | ICD-10-CM | POA: Diagnosis not present

## 2018-03-25 DIAGNOSIS — M62830 Muscle spasm of back: Secondary | ICD-10-CM | POA: Diagnosis not present

## 2018-03-25 DIAGNOSIS — M9903 Segmental and somatic dysfunction of lumbar region: Secondary | ICD-10-CM | POA: Diagnosis not present

## 2018-03-27 DIAGNOSIS — M9902 Segmental and somatic dysfunction of thoracic region: Secondary | ICD-10-CM | POA: Diagnosis not present

## 2018-03-27 DIAGNOSIS — M9901 Segmental and somatic dysfunction of cervical region: Secondary | ICD-10-CM | POA: Diagnosis not present

## 2018-03-27 DIAGNOSIS — M9903 Segmental and somatic dysfunction of lumbar region: Secondary | ICD-10-CM | POA: Diagnosis not present

## 2018-03-27 DIAGNOSIS — M62830 Muscle spasm of back: Secondary | ICD-10-CM | POA: Diagnosis not present

## 2018-03-28 DIAGNOSIS — M9901 Segmental and somatic dysfunction of cervical region: Secondary | ICD-10-CM | POA: Diagnosis not present

## 2018-03-28 DIAGNOSIS — M9902 Segmental and somatic dysfunction of thoracic region: Secondary | ICD-10-CM | POA: Diagnosis not present

## 2018-03-28 DIAGNOSIS — M9903 Segmental and somatic dysfunction of lumbar region: Secondary | ICD-10-CM | POA: Diagnosis not present

## 2018-03-28 DIAGNOSIS — M62830 Muscle spasm of back: Secondary | ICD-10-CM | POA: Diagnosis not present

## 2018-04-15 ENCOUNTER — Ambulatory Visit: Payer: BLUE CROSS/BLUE SHIELD | Admitting: Family Medicine

## 2018-04-15 ENCOUNTER — Encounter: Payer: Self-pay | Admitting: Family Medicine

## 2018-04-15 VITALS — BP 103/68 | HR 64 | Wt 121.0 lb

## 2018-04-15 DIAGNOSIS — I951 Orthostatic hypotension: Secondary | ICD-10-CM | POA: Diagnosis not present

## 2018-04-15 NOTE — Progress Notes (Signed)
       Brittney Lozano is a 22 y.o. female who presents to [facility]: Primary Care [facility] Medicine today for follow-up orthostasis.  Brittney Lozano had a history of orthostatic hypotension recently.  She is adjusted her eating habits and is feeling completely fine now.  She denies any issues.  No fevers chills nausea vomiting or diarrhea.   ROS as above:  Exam:  BP 103/68   Pulse 64   Wt 121 lb (54.9 kg)   BMI 21.43 kg/m  Wt Readings from Last 5 Encounters:  04/15/18 121 lb (54.9 kg)  03/21/18 121 lb (54.9 kg)  12/11/17 117 lb (53.1 kg)  07/09/17 113 lb (51.3 kg)    Gen: Well NAD HEENT: EOMI,  MMM Lungs: Normal work of breathing. CTABL Heart: RRR no MRG Abd: NABS, Soft. Nondistended, Nontender Exts: Brisk capillary refill, warm and well perfused.   Lab and Radiology Results No results found for this or any previous visit (from the past 72 hour(s)). No results found.    Assessment and Plan: 22 y.o. female with resolved orthostasis.  Doing well.  Continue careful eating habits and focus on adequate hydration when busy.  Recheck as needed.  Patient is due for a well woman exam.  She will schedule with OB/GYN.   No orders of the defined types were placed in this encounter.  No orders of the defined types were placed in this encounter.    Historical information moved to improve visibility of documentation.  No past medical history on file. Past Surgical History:  Procedure Laterality Date  . FRACTURE SURGERY     Social History   Tobacco Use  . Smoking status: Never Smoker  . Smokeless tobacco: Never Used  Substance Use Topics  . Alcohol use: No    Frequency: Never   family history includes Diabetes in her brother; Heart disease in her father and mother; Hypertension in her mother.  Medications: Current Outpatient Medications  Medication Sig Dispense Refill  . lidocaine (XYLOCAINE)  5 % ointment Apply 1 application topically as needed. 50 g 1  . norgestimate-ethinyl estradiol (ORTHO-CYCLEN,SPRINTEC,PREVIFEM) 0.25-35 MG-MCG tablet Take 1 tablet by mouth daily. Use as directed. 1 Package 11   No current facility-administered medications for this visit.    No Known Allergies   Discussed warning signs or symptoms. Please see discharge instructions. Patient expresses understanding.

## 2018-04-15 NOTE — Patient Instructions (Signed)
Thank you for coming in today. Recheck as needed. Try to get some calcium in your day consider calcium suppliment like Oscal D. Recheck as needed.

## 2018-05-28 ENCOUNTER — Encounter: Payer: Self-pay | Admitting: Family Medicine

## 2018-05-28 MED ORDER — NORGESTIMATE-ETH ESTRADIOL 0.25-35 MG-MCG PO TABS: 1.0000 | ORAL_TABLET | Freq: Every day | ORAL | 11 refills | Status: AC

## 2018-09-02 ENCOUNTER — Encounter: Payer: Self-pay | Admitting: Family Medicine

## 2018-09-02 ENCOUNTER — Ambulatory Visit (INDEPENDENT_AMBULATORY_CARE_PROVIDER_SITE_OTHER): Payer: BLUE CROSS/BLUE SHIELD | Admitting: Family Medicine

## 2018-09-02 VITALS — BP 108/68 | HR 61 | Wt 117.0 lb

## 2018-09-02 DIAGNOSIS — R202 Paresthesia of skin: Secondary | ICD-10-CM

## 2018-09-02 DIAGNOSIS — R200 Anesthesia of skin: Secondary | ICD-10-CM | POA: Diagnosis not present

## 2018-09-02 DIAGNOSIS — M25531 Pain in right wrist: Secondary | ICD-10-CM

## 2018-09-02 NOTE — Progress Notes (Signed)
Brittney Lozano is a 23 y.o. female who presents to [facility] today for right hand numbness and tingling and forearm pain.    Brittney Lozano was in her normal state of health in June 2019.  She was kicked in the dorsal right forearm by a horse.  She was seen and had some pain and swelling which resolved.  X-ray at that time was negative.  She did have improvement but notes continued numbness and tingling into her right hand and forearm.  She notes that her pinky and ring finger still have numbness in them and she has some pain in the ulnar wrist and forearm as well.  Symptoms are worse with wrist flexion activities.  She is somewhat limited and has trouble holding onto a heavy horse or with lifting activities.  She denies significant weakness.  No fevers or chills nausea vomiting or diarrhea.  She is not had physical therapy for this yet.    ROS:  As above  Exam:  BP 108/68   Pulse 61   Wt 117 lb (53.1 kg)   BMI 20.73 kg/m  Wt Readings from Last 5 Encounters:  09/02/18 117 lb (53.1 kg)  04/15/18 121 lb (54.9 kg)  03/21/18 121 lb (54.9 kg)  12/11/17 117 lb (53.1 kg)  07/09/17 113 lb (51.3 kg)   General: Well Developed, well nourished, and in no acute distress.  Neuro/Psych: Alert and oriented x3, extra-ocular muscles intact, able to move all 4 extremities, sensation grossly intact. Skin: Warm and dry, no rashes noted.  Respiratory: Not using accessory muscles, speaking in full sentences, trachea midline.  Cardiovascular: Pulses palpable, no extremity edema. Abdomen: Does not appear distended. MSK:  Arm normal-appearing without significant swelling. Normal-appearing normal motion.  Mildly positive Tinel's at cubital tunnel.  Elbow is nontender. Elbow flexion and extension strength are intact.  Forearm mildly tender to palpation along ulnar forearm.  This somewhat reproduces her numbness and tingly sensation distally.  No masses  palpated.  Wrist normal-appearing.  Mildly tender palpation at ulnar wrist at flexor carpi ulnaris. Wrist motion is intact.  Some pain and weakness with resisted wrist flexion. Wrist extension strength is pain-free and normal.  Hand normal-appearing normal motion.  Slight decreased sensation fifth digit and ulnar side of fourth digit. Grip strength slightly diminished 4+/5.  Abduction and adduction normal. Pulses are intact.  Contralateral left arm normal-appearing nontender normal motion normal strength.   Lab and Radiology Results  Limited musculoskeletal ultrasound right ulnar forearm: Normal-appearing ulnar nerve from wrist to elbow. Pressure at ulnar nerve and mid ulnar wrist does reproduce her paresthesias. Flexor carpi ulnaris visualized relatively normal-appearing. Normal-appearing vascular structures. Normal-appearing bony structures. Ulnar nerve relatively normal-appearing cubital tunnel   EXAM: RIGHT FOREARM - 2 VIEW  COMPARISON:  None.  FINDINGS: Frontal and lateral views obtained. There is soft tissue swelling. No fracture or dislocation. Joint spaces appear normal. No elbow joint effusion. No appreciable arthropathy.  IMPRESSION: Soft tissue swelling. No fracture or dislocation. No appreciable arthropathic change.   Electronically Signed   By: Bretta Bang III M.D.   On: 12/11/2017 14:48 I personally (independently) visualized and performed the interpretation of the images attached in this note.     Assessment and Plan: 23 y.o. female with right forearm pain and paresthesias.  This resulted from a injury to her dorsal forearm from a horse kick over 8 months ago.  Patient still has persistent paresthesias in her ulnar nerve distribution.  She additionally has  some weakness and pain with wrist flexion.  Discussed options.  Plan for EMG and referral to physical therapy.  There is no dedicated hand therapy in [facility] where she lives.   We will proceed with general physical therapy.  If not improving or if needed will refer directly to formal hand therapy versus occupational therapy.   Recheck in about 1 month.   PDMP not reviewed this encounter. Orders Placed This Encounter  Procedures  . Ambulatory referral to Physical Therapy    Referral Priority:   Routine    Referral Type:   Physical Medicine    Referral Reason:   Specialty Services Required    Requested Specialty:   Physical Therapy  . NCV with EMG(electromyography)    Standing Status:   Future    Standing Expiration Date:   09/02/2019    Order Specific Question:   Where should this test be performed?    Answer:   [facility]   No orders of the defined types were placed in this encounter.   Historical information moved to improve visibility of documentation.  No past medical history on file. Past Surgical History:  Procedure Laterality Date  . FRACTURE SURGERY     Social History   Tobacco Use  . Smoking status: Never Smoker  . Smokeless tobacco: Never Used  Substance Use Topics  . Alcohol use: No    Frequency: Never   family history includes Diabetes in her brother; Heart disease in her father and mother; Hypertension in her mother.  Medications: Current Outpatient Medications  Medication Sig Dispense Refill  . lidocaine (XYLOCAINE) 5 % ointment Apply 1 application topically as needed. 50 g 1  . norgestimate-ethinyl estradiol (ORTHO-CYCLEN,SPRINTEC,PREVIFEM) 0.25-35 MG-MCG tablet Take 1 tablet by mouth daily. Use as directed. 1 Package 11   No current facility-administered medications for this visit.    No Known Allergies    Discussed warning signs or symptoms. Please see discharge instructions. Patient expresses understanding.

## 2018-09-02 NOTE — Patient Instructions (Addendum)
Thank you for coming in today. You should hear from therapy soon.  You should hear about nerve study as well.   Let me know if you do not hear anything.   Recheck in 1 month.  Return sooner if needed.   Try tylenol or ibuprofen .   Flexor Carpi Ulnaris and [employer] Radialis Tendinitis  The flexor carpi ulnaris (FCU) and the flexor carpi radialis (FCR) are tendons in your forearm. Tendons connect muscles to bones. The FCU is located on the pinkie side of your forearm, and the FCR is located on the thumb side of your forearm. FCU tendinitis is inflammation of the FCU, and FCR tendinitis is inflammation of the FCR. These conditions cause wrist pain. What are the causes? These conditions may be caused by:  Repetitive motions or overuse (common).  Wear and tear. (common).  An injury.  Excessive exercise or strain.  Certain antibiotic medicines. In some cases, the cause may not be known. What increases the risk? These conditions are more likely to develop in:  People who play sports that involve constantly flexing or stretching the wrist and forearm, such as volleyball and water polo.  Older adults.  People with have a job that involves flexing the wrist over and over, such as people who work as Journalist, newspaper, Tax inspector, Facilities manager.  People with certain health conditions, such as: ? Rheumatoid arthritis. ? Gout. ? Diabetes. What are the signs or symptoms? Symptoms of these conditions may develop gradually. Symptoms include:  Pain or tenderness in the wrist.  Pain when flexing or stretching the wrist.  Pain when gripping or lifting with the palm of the hand.  Swelling. How is this diagnosed? This condition may be diagnosed based on:  Your symptoms.  Your medical history.  A physical exam. During the physical exam, you may be asked to move your hand, wrist, and arm in certain ways. In order to rule out another condition, your health care provider may order one or  more of the following tests:  MRI to get detailed images of the body's soft tissues and detect tendon tears and inflammation.  Ultrasound to detect soft-tissue injuries, such as tears and inflammation of the ligaments or tendons. How is this treated? Treatment for this condition may include:  Rest. You should limit activities that cause your symptoms to get worse or flare up.  Heat and ice treatment. Both heat and cold can help to ease pain and may be applied to the wrist or forearm as needed to reduce pain and inflammation.  Splint. You may need to wear a splint to keep your wrist and forearm from moving (keep them immobilized) until your symptoms improve.  Medicine. Your health care provider may prescribe steroids or other anti-inflammatory medicines, like ibuprofen, to temporarily ease your pain and other symptoms.  Physical therapy. Your health care provider may ask you to do exercises to maintain mobility and range of motion in your wrist. Follow these instructions at home: If you have a splint:  Wear it as told by your health care provider. Remove it only as told by your health care provider.  Loosen the splint if your fingers tingle, become numb, or turn cold and blue.  Do not let your splint get wet if it is not waterproof.  Keep the splint clean. Managing pain, stiffness, and swelling   If directed, apply ice to the injured area. ? Put ice in a plastic bag. ? Place a damp towel between your skin and  the bag. ? Leave the ice on for 20 minutes, 2-3 times a day.  Move your fingers often to avoid stiffness and to lessen swelling.  Raise (elevate) the injured area above the level of your heart while you are sitting or lying down. Activity  Return to your normal activities as told by your health care provider. Ask your health care provider what activities are safe for you.  Do exercises as told by your health care provider. General instructions   Do not use any  tobacco products, including cigarettes, chewing tobacco, or e-cigarettes. Tobacco can delay healing. If you need help quitting, ask your health care provider.  Take over-the-counter and prescription medicines only as told by your health care provider.  Keep all follow-up visits as told by your health care provider. This is important. How is this prevented?  Warm up and stretch before being active.  Cool down and stretch after being active.  Give your body time to rest between periods of activity.  Make sure to use equipment that fits you.  Be safe and responsible while being active to avoid falls.  Do at least 150 minutes of moderate-intensity exercise each week, such as brisk walking or water aerobics.  Maintain physical fitness, including: ? Strength. ? Flexibility. ? Cardiovascular fitness. ? Endurance. Contact a health care provider if:  Your pain does not improve.  Your pain gets worse. Get help right away if:  Your pain is severe.  You cannot move your wrist. This information is not intended to replace advice given to you by your health care provider. Make sure you discuss any questions you have with your health care provider. Document Released: 06/19/2005 Document Revised: 08/25/2016 Document Reviewed: 02/26/2015 Elsevier Interactive Patient Education  2019 [employer].

## 2018-09-09 DIAGNOSIS — M9901 Segmental and somatic dysfunction of cervical region: Secondary | ICD-10-CM | POA: Diagnosis not present

## 2018-09-09 DIAGNOSIS — M9903 Segmental and somatic dysfunction of lumbar region: Secondary | ICD-10-CM | POA: Diagnosis not present

## 2018-09-09 DIAGNOSIS — M62830 Muscle spasm of back: Secondary | ICD-10-CM | POA: Diagnosis not present

## 2018-09-09 DIAGNOSIS — M9902 Segmental and somatic dysfunction of thoracic region: Secondary | ICD-10-CM | POA: Diagnosis not present

## 2018-10-09 ENCOUNTER — Encounter: Payer: BLUE CROSS/BLUE SHIELD | Admitting: Neurology

## 2018-11-06 ENCOUNTER — Telehealth: Payer: Self-pay

## 2018-11-06 NOTE — Telephone Encounter (Signed)
I called and spoke with patient about rescheduling her nerve conduction study with Dr. Anne Hahn and she did not have her calendar with her at the moment and asked if she could call back in the morning and I advised her that was perfectly fine. When she calls back, she can be scheduled for NCV/EMG with Dr. Anne Hahn whenever is good for her.

## 2018-11-19 ENCOUNTER — Encounter: Payer: Self-pay | Admitting: Neurology

## 2018-12-19 ENCOUNTER — Other Ambulatory Visit: Payer: Self-pay

## 2018-12-19 ENCOUNTER — Emergency Department (INDEPENDENT_AMBULATORY_CARE_PROVIDER_SITE_OTHER): Payer: BC Managed Care – PPO

## 2018-12-19 ENCOUNTER — Emergency Department (INDEPENDENT_AMBULATORY_CARE_PROVIDER_SITE_OTHER)
Admission: EM | Admit: 2018-12-19 | Discharge: 2018-12-19 | Disposition: A | Discharge status: Home / Self Care | Payer: BC Managed Care – PPO | Source: Home / Self Care

## 2018-12-19 DIAGNOSIS — W5512XA Struck by horse, initial encounter: Secondary | ICD-10-CM

## 2018-12-19 DIAGNOSIS — M79672 Pain in left foot: Secondary | ICD-10-CM

## 2018-12-19 DIAGNOSIS — M25512 Pain in left shoulder: Secondary | ICD-10-CM

## 2018-12-19 DIAGNOSIS — S9032XA Contusion of left foot, initial encounter: Secondary | ICD-10-CM

## 2018-12-19 NOTE — ED Triage Notes (Signed)
Pt was ran over by a horse about an hour ago.  Having shoulder pain with any movement.

## 2018-12-19 NOTE — ED Provider Notes (Signed)
[facility]    CSN: [id number] Arrival date & time: 12/19/18  1539     History   Chief Complaint Chief Complaint  Patient presents with   Shoulder Injury    HPI Brittney Lozano is a 23 y.o. female.   HPI  Brittney Lozano is a 23 y.o. female presenting to UC with c/o Left shoulder pain and mild Left foot pain after being trampled over by a horse that she was trying to stop from running into a child.  Pt denies hitting her head or LOC.  Pain is worst in her shoulder with movement.  Denies head, neck, back, chest, abdominal or hip pain.  Pain is 4/10 at rest in her shoulder, 8/10 with movement. She is Right hand dominant. No prior hx of fractures or dislocation of her shoulder.  Denies radiation of pain or numbness down her arm. She took ibuprofen about 58min PTA.    History reviewed. No pertinent past medical history.  There are no active problems to display for this patient.   Past Surgical History:  Procedure Laterality Date   FRACTURE SURGERY      OB History   No obstetric history on file.      Home Medications    Prior to Admission medications   Medication Sig Start Date End Date Taking? Authorizing Provider  lidocaine (XYLOCAINE) 5 % ointment Apply 1 application topically as needed. 07/09/17   Gregor Hams, MD  norgestimate-ethinyl estradiol (ORTHO-CYCLEN,SPRINTEC,PREVIFEM) 0.25-35 MG-MCG tablet Take 1 tablet by mouth daily. Use as directed. 05/28/18   Gregor Hams, MD    Family History Family History  Problem Relation Age of Onset   Hypertension Mother    Heart disease Mother    Heart disease Father    Diabetes Brother     Social History Social History   Tobacco Use   Smoking status: Never Smoker   Smokeless tobacco: Never Used  Substance Use Topics   Alcohol use: No    Frequency: Never   Drug use: No     Allergies   Patient has no known allergies.   Review of Systems Review of Systems  Musculoskeletal: Positive for  arthralgias and myalgias. Negative for back pain, neck pain and neck stiffness.  Skin: Positive for color change and wound.  Neurological: Negative for dizziness, weakness, light-headedness, numbness and headaches.     Physical Exam Triage Vital Signs ED Triage Vitals  Enc Vitals Group     BP 12/19/18 1559 124/85     Pulse Rate 12/19/18 1559 78     Resp 12/19/18 1559 20     Temp --      Temp src --      SpO2 12/19/18 1559 99 %     Weight 12/19/18 1600 122 lb (55.3 kg)     Height 12/19/18 1600 5\' 2"  (1.575 m)     Head Circumference --      Peak Flow --      Pain Score 12/19/18 1600 4     Pain Loc --      Pain Edu? --      Excl. in [place]? --    No data found.  Updated Vital Signs BP 124/85 (BP Location: Right Arm)    Pulse 78    Resp 20    Ht 5\' 2"  (1.575 m)    Wt 122 lb (55.3 kg)    LMP 12/19/2018    SpO2 99%    BMI 22.31 kg/m  Visual Acuity Right Eye Distance:   Left Eye Distance:   Bilateral Distance:    Right Eye Near:   Left Eye Near:    Bilateral Near:     Physical Exam Vitals signs and nursing note reviewed.  Constitutional:      Appearance: Normal appearance. She is well-developed.  HENT:     Head: Normocephalic. Abrasion present.      Comments: Mild abrasion to Left upper eyelid. No edema or tenderness.     Right Ear: Tympanic membrane and external ear normal.     Left Ear: Tympanic membrane and external ear normal.     Nose: Nose normal.  Eyes:     Extraocular Movements: Extraocular movements intact.     Pupils: Pupils are equal, round, and reactive to light.  Neck:     Musculoskeletal: Normal range of motion and neck supple.     Comments: No midline bone tenderness, no crepitus or step-offs.  Cardiovascular:     Rate and Rhythm: Normal rate and regular rhythm.  Pulmonary:     Effort: Pulmonary effort is normal. No respiratory distress.     Breath sounds: Normal breath sounds. No stridor. No wheezing, rhonchi or rales.  Chest:     Chest wall: No  tenderness.  Abdominal:     General: There is no distension.     Palpations: Abdomen is soft.     Tenderness: There is no abdominal tenderness.  Musculoskeletal: Normal range of motion.        General: Tenderness present. No swelling or deformity.     Comments: No spinal tenderness. Full ROM Left shoulder with increased pain on full abduction. Tenderness over proximal deltoid and biceps groove.  Left elbow: non-tender, full ROM 5/5 grip strength bilaterally.   Left foot: ecchymosis, tenderness over proximal fifth metatarsal. Full ROM ankle and toes w/o tenderness.   Skin:    General: Skin is warm and dry.     Capillary Refill: Capillary refill takes less than 2 seconds.     Findings: Bruising present.  Neurological:     General: No focal deficit present.     Mental Status: She is alert and oriented to person, place, and time.  Psychiatric:        Behavior: Behavior normal.      UC Treatments / Results  Labs (all labs ordered are listed, but only abnormal results are displayed) Labs Reviewed - No data to display  EKG None  Radiology Dg Shoulder Left  Result Date: 12/19/2018 CLINICAL DATA:  Ran over by horse earlier today now with left anterior shoulder pain. EXAM: LEFT SHOULDER - 2+ VIEW COMPARISON:  None. FINDINGS: No fracture or dislocation. Joint spaces are preserved. No evidence of calcific tendinitis. Limited visualization of the adjacent thorax is normal. Regional soft tissues appear normal. No radiopaque foreign body. IMPRESSION: No fracture, dislocation or radiopaque foreign body Electronically Signed   By: Simonne ComeJohn  Watts M.D.   On: 12/19/2018 16:40   Dg Foot Complete Left  Result Date: 12/19/2018 CLINICAL DATA:  Ran over by horse today now with left lateral metatarsal pain. EXAM: LEFT FOOT - COMPLETE 3+ VIEW COMPARISON:  None. FINDINGS: No definite fracture or dislocation. Joint spaces appear preserved. No significant hallux valgus deformity. No erosions. No plantar  calcaneal spur. Regional soft tissues appear normal. No radiopaque foreign body. IMPRESSION: No fracture or radiopaque foreign body. Electronically Signed   By: Simonne ComeJohn  Watts M.D.   On: 12/19/2018 16:39    Procedures Procedures (including  critical care time)  Medications Ordered in UC Medications - No data to display  Initial Impression / Assessment and Plan / UC Course  I have reviewed the triage vital signs and the nursing notes.  Pertinent labs & imaging results that were available during my care of the patient were reviewed by me and considered in my medical decision making (see chart for details).     Reviewed imaging with pt Pt declined prescription pain medication. Encouraged alternating ice, heat, tylenol and motrin F/u with Dr. Denyse Amassorey next week if not improving AVS provided.  Final Clinical Impressions(s) / UC Diagnoses   Final diagnoses:  Left shoulder pain  Struck by horse, initial encounter  Contusion of left foot, initial encounter     Discharge Instructions      You may take 500mg  acetaminophen every 4-6 hours or in combination with ibuprofen 400-600mg  every 6-8 hours as needed for pain and inflammation. It is recommended you apply a cool compress to your shoulder 2-3 times daily for up to 20 minutes at a time for the next 2-3 days to help with pain and swelling. You may then transition to a warm compress such as a heating pad to help with muscle soreness.   Please follow up with family medicine or sports medicine next week for recheck of symptoms if not improving.      ED Prescriptions    None     Controlled Substance Prescriptions [place] Controlled Substance Registry consulted? Not Applicable   Rolla Platehelps, Epifania Littrell O, PA-C 12/19/18 [id number]

## 2018-12-19 NOTE — Discharge Instructions (Signed)
°  You may take 500mg  acetaminophen every 4-6 hours or in combination with ibuprofen 400-600mg  every 6-8 hours as needed for pain and inflammation. It is recommended you apply a cool compress to your shoulder 2-3 times daily for up to 20 minutes at a time for the next 2-3 days to help with pain and swelling. You may then transition to a warm compress such as a heating pad to help with muscle soreness.   Please follow up with family medicine or sports medicine next week for recheck of symptoms if not improving.

## 2018-12-23 DIAGNOSIS — M9902 Segmental and somatic dysfunction of thoracic region: Secondary | ICD-10-CM | POA: Diagnosis not present

## 2018-12-23 DIAGNOSIS — M62830 Muscle spasm of back: Secondary | ICD-10-CM | POA: Diagnosis not present

## 2018-12-23 DIAGNOSIS — M9901 Segmental and somatic dysfunction of cervical region: Secondary | ICD-10-CM | POA: Diagnosis not present

## 2018-12-23 DIAGNOSIS — M9903 Segmental and somatic dysfunction of lumbar region: Secondary | ICD-10-CM | POA: Diagnosis not present

## 2018-12-24 ENCOUNTER — Ambulatory Visit (INDEPENDENT_AMBULATORY_CARE_PROVIDER_SITE_OTHER): Payer: BC Managed Care – PPO | Admitting: Family Medicine

## 2018-12-24 ENCOUNTER — Encounter: Payer: Self-pay | Admitting: Family Medicine

## 2018-12-24 VITALS — BP 107/72 | HR 60 | Temp 98.2°F | Wt 118.0 lb

## 2018-12-24 DIAGNOSIS — W5512XA Struck by horse, initial encounter: Secondary | ICD-10-CM

## 2018-12-24 DIAGNOSIS — S43006A Unspecified dislocation of unspecified shoulder joint, initial encounter: Secondary | ICD-10-CM

## 2018-12-24 DIAGNOSIS — M25512 Pain in left shoulder: Secondary | ICD-10-CM

## 2018-12-24 MED ORDER — DICLOFENAC SODIUM 1 % TD GEL: 4.0000 g | Freq: Four times a day (QID) | TRANSDERMAL | 11 refills | Status: AC

## 2018-12-24 NOTE — Progress Notes (Signed)
Brittney Lozano is a 23 y.o. female who [facility] today for left shoulder pain and left foot pain.  Patient was trampled by a horse that she was trying to stop from running into a child on or around June 18.  She was seen in urgent care on the 18th.  X-rays of the left shoulder and foot were normal.  She was given ibuprofen and Tylenol and recommended conservative management.  She is also advised to follow-up with me.  In the interim she notes that she continues to have left shoulder pain.  She notes pain at the superior aspect of the shoulder.  Pain is present with abduction reaching back and at bedtime.  She denies any radiating pain weakness or numbness distally.  She is tried ice rest ibuprofen which helps a little.  She notes her foot is no longer painful.  She denies any significant neck pain.  She feels well otherwise.  No fevers or chills.   ROS as above:  Exam:  BP 107/72   Pulse 60   Temp 98.2 F (36.8 C) (Oral)   Wt 118 lb (53.5 kg)   LMP 12/19/2018   BMI 21.58 kg/m  Wt Readings from Last 5 Encounters:  12/24/18 118 lb (53.5 kg)  12/19/18 122 lb (55.3 kg)  09/02/18 117 lb (53.1 kg)  04/15/18 121 lb (54.9 kg)  03/21/18 121 lb (54.9 kg)    Gen: Well NAD HEENT: EOMI,  MMM Lungs: Normal work of breathing. CTABL Heart: RRR no MRG Abd: NABS, Soft. Nondistended, Nontender Exts: Brisk capillary refill, warm and well perfused.  Left shoulder normal-appearing with no bruising or deformity. [facility].  Nontender otherwise. Range of motion abduction limited to 100 degrees active and passive. Normal external rotation. Internal rotation limited to lumbar spine. Strength is intact abduction external rotation internal rotation but painful with abduction. Positive Hawkins and Neer's test.  Positive empty can test.  Contralateral   right shoulder normal-appearing nontender normal motion normal strength negative impingement testing.  Pulses cap refill and sensation are intact bilateral upper extremities.  Lab and Radiology Results Musculoskeletal ultrasound of left shoulder. Biceps tendon intact in groove normal-appearing Scapularis tendon intact and normal. Supraspinatus tendon intact and normal-appearing Infraspinatus tendon intact and normal-appearing AC joint with effusion with mild separation present.  Dg Shoulder Left  Result Date: 12/19/2018 CLINICAL DATA:  Ran over by horse earlier today now with left anterior shoulder pain. EXAM: LEFT SHOULDER - 2+ VIEW COMPARISON:  None. FINDINGS: No fracture or dislocation. Joint spaces are preserved. No evidence of calcific tendinitis. Limited visualization of the adjacent thorax is normal. Regional soft tissues appear normal. No radiopaque foreign body. IMPRESSION: No fracture, dislocation or radiopaque foreign body Electronically Signed   By: Simonne ComeJohn  Watts M.D.   On: 12/19/2018 16:40   Dg Foot Complete Left  Result Date: 12/19/2018 CLINICAL DATA:  Ran over by horse today now with left lateral metatarsal pain. EXAM: LEFT FOOT - COMPLETE 3+ VIEW COMPARISON:  None. FINDINGS: No definite fracture or dislocation. Joint spaces appear preserved. No significant hallux valgus deformity. No erosions. No plantar calcaneal spur. Regional soft tissues appear normal. No radiopaque foreign body. IMPRESSION: No fracture or radiopaque foreign body. Electronically Signed   By: Simonne ComeJohn  Watts M.D.   On: 12/19/2018 16:39      Assessment and Plan: 23 y.o. female with left shoulder pain.  Likely AC separation  grade 1.  Possible rotator cuff strain however no rotator cuff tear visible on ultrasound.  Plan for sling diclofenac gel oral NSAIDs.  Additionally work on home range of motion exercise followed by strengthening exercises.  If not improving will proceed with formal physical therapy.  Reassess  in 3 weeks or sooner via virtual visit if needed.  PDMP not reviewed this encounter. No orders of the defined types were placed in this encounter.  Meds ordered this encounter  Medications  . diclofenac sodium (VOLTAREN) 1 % GEL    Sig: Apply 4 g topically 4 (four) times daily. To affected joint.    Dispense:  100 g    Refill:  11     Historical information moved to improve visibility of documentation.  No past medical history on file. Past Surgical History:  Procedure Laterality Date  . FRACTURE SURGERY     Social History   Tobacco Use  . Smoking status: Never Smoker  . Smokeless tobacco: Never Used  Substance Use Topics  . Alcohol use: No    Frequency: Never   family history includes Diabetes in her brother; Heart disease in her father and mother; Hypertension in her mother.  Medications: Current Outpatient Medications  Medication Sig Dispense Refill  . norgestimate-ethinyl estradiol (ORTHO-CYCLEN,SPRINTEC,PREVIFEM) 0.25-35 MG-MCG tablet Take 1 tablet by mouth daily. Use as directed. 1 Package 11  . diclofenac sodium (VOLTAREN) 1 % GEL Apply 4 g topically 4 (four) times daily. To affected joint. 100 g 11  . lidocaine (XYLOCAINE) 5 % ointment Apply 1 application topically as needed. 50 g 1   No current facility-administered medications for this visit.    No Known Allergies   Discussed warning signs or symptoms. Please see discharge instructions. Patient expresses understanding.

## 2018-12-24 NOTE — Patient Instructions (Addendum)
Thank you for coming in today. Work on shoulder motion.  Start active exercise when able.  Use the diclofenac gel on the shoulder.  OK to take with iburpfoen or naproxen for pain.   Recheck in 3 weeks if not a lot better. We may make that a video visit.    Acromioclavicular Separation  A shoulder separation (acromioclavicular separation) is an injury to the tissues that connect bones to each other (ligaments) between the top of your shoulder blade (acromion) and your collarbone (clavicle). The ligaments may be stretched, partially torn, or completely torn.  A stretched ligament may not cause much pain, and it does not move the collarbone out of place. A stretched ligament looks normal on an X-ray.  A partial tear causes an injury that is a bit worse, and it may move the collarbone slightly out of place.  A complete tear causes serious injury. The surrounding shoulder ligaments are completely torn. This moves the collarbone out of position and creates a bad shape (deformity) of the shoulder. What are the causes? Common causes of this condition include:  Falling on the shoulder.  Receiving a hard, direct hit (blow) to the top of the shoulder.  Falling on an outstretched arm. What increases the risk? You may be at greater risk of a shoulder separation if you:  Are female.  Are younger than 1335.  Play a contact sport, such as football or hockey. What are the signs or symptoms? The most common symptom of a shoulder separation is pain on the top of the shoulder after falling on it or receiving a blow to it. Other signs and symptoms include:  Shoulder deformity.  Swelling of the shoulder.  Decreased ability to move the shoulder.  Bruising on top of the shoulder. How is this diagnosed? Your health care provider may suspect a shoulder separation based on your symptoms and the details of a recent injury you experienced. The condition will be diagnosed based on:  A physical exam.  Your provider may: ? Press on your shoulder. ? Test the movement of your shoulder. ? Ask you to hold a weight in your hand to see if the separation increases.  Imaging tests, such as: ? X-rays. ? MRI. How is this treated? Treatment for this condition depends on the cause and severity of the injury.  A shoulder separation caused by a stretched ligament may require 2-12 weeks of the following: ? Wearing a sling. ? Taking medicines to help relieve pain. ? Applying cold packs to your shoulder. ? Physical therapy. If needed, a physical therapist will teach you to do daily exercises to strengthen your shoulder muscles and prevent stiffness.  Surgery may be needed for severe injuries that include breaks (fractures) in a bone, or injuries that do not get better with nonsurgical treatments. To help with healing, you will need to keep your joint in place for a period of time (immobilization) and do physical therapy. Follow these instructions at home: Medicines  Take over-the-counter and prescription medicines only as told by your health care provider.  Do not drive or use heavy machinery while taking prescription pain medicine.  If you are taking prescription pain medicine, take actions to prevent or treat constipation. Your health care provider may recommend that you: ? Drink enough fluid to keep your urine pale yellow. ? Eat foods that are high in fiber, such as fresh fruits and vegetables, whole grains, and beans. ? Limit foods that are high in fat and processed sugars, such  as fried or sweet foods. ? Take an over-the-counter or prescription medicine for constipation. If you have a sling:  Wear your sling as told by your health care provider. Remove it only as told by your health care provider.  Loosen the sling if your fingers tingle, become numb, or turn cold and blue.  Keep the sling clean.  If the sling is not waterproof: ? Do not let it get wet. ? Cover it with a watertight  covering when you take a bath or a shower. Managing pain, stiffness, and swelling   If directed, apply ice to the top of your shoulder: ? Put ice in a plastic bag. ? Place a towel between your skin and the bag. ? Leave the ice on for 20 minutes, 2-3 times a day.  Do not do any activities that make your pain worse. Activity  Do not lift anything that is heavier than 10 lb (4.5 kg), or the limit that you were told, until your health care provider says that it is safe.  Rest your shoulder. Avoid activities that take a lot of effort (strenuous activities) for as long as told by your health care provider.  Return to your normal activities as told by your health care provider. Ask your health care provider what activities are safe for you.  Do range of motion exercises as told by your health care provider. General instructions  Do not use any products that contain nicotine or tobacco, such as cigarettes and e-cigarettes. These can delay healing. If you need help quitting, ask your health care provider.  Keep all follow-up visits as told by your health care provider. This is important. These include visits for physical therapy as directed by your health care provider. Contact a health care provider if:  Your pain medicine is not relieving your pain.  Your pain and stiffness are not improving after 2 weeks.  You are unable to do your physical therapy exercises because of pain or stiffness. Get help right away if:  Your arm on the injured side feels cold or numb.  Your skin or fingers on the arm on the injured side turn blue or gray. Summary  A shoulder separation (acromioclavicular separation) is an injury to the tissues that connect bones to each other (ligaments) between the top of your shoulder blade (acromion) and your collarbone (clavicle).  The ligaments may be stretched, partially torn, or completely torn.  The most common cause of a shoulder separation is falling on or  receiving a blow to the top of the shoulder. Falling with an outstretched arm may also cause this injury.  Rest your shoulder. Avoid activities that take a lot of effort (strenuous activities) for as long as told by your health care provider. This information is not intended to replace advice given to you by your health care provider. Make sure you discuss any questions you have with your health care provider. Document Released: 03/29/2005 Document Revised: 08/04/2017 Document Reviewed: 08/04/2017 Elsevier Interactive Patient Education  2019 [place] After Surgery Ask your health care provider which exercises are safe for you. Do exercises exactly as told by your health care provider and adjust them as directed. It is normal to feel mild stretching, pulling, tightness, or discomfort as you do these exercises, but you should stop right away if you feel sudden pain or your pain gets worse. Do not begin these exercises until told by your health care provider. Stretching and range of motion  exercises These exercises warm up your muscles and joints and improve the movement and flexibility of your shoulder. These exercises also help to relieve pain, numbness, and tingling. Exercise A: Pendulum  1. Stand near a wall or a surface that you can hold onto for balance. 2. Bend at the waist and let your left / right arm hang straight down. Use your other arm to keep your balance. 3. Relax your arm and shoulder muscles, and move your hips and your trunk so your left / right arm swings freely. Your arm should swing because of the motion of your body, not because you are using your arm or shoulder muscles. 4. Keep moving so your arm swings in the following directions, as told by your health care provider: ? Side to side. ? Forward and backward. ? In clockwise and counterclockwise circles. Repeat __________ times, or for __________ seconds per direction. Complete this  exercise __________ times a day. Exercise B: Flexion, standing  1. Stand and hold a broomstick, a cane, or a similar object with your hands. Place your hands a little more than shoulder-width apart on the object. Your left / right hand should be palm-up, and your other hand should be palm-down. 2. Push the stick down with your healthy arm to raise your left / right arm in front of your body, and then over your head. Use your other hand to help move the stick. Stop when you feel a stretch in your shoulder, or when you reach the angle that is recommended by your health care provider. ? Avoid shrugging your shoulder while you raise your arm. Keep your shoulder blade tucked down toward your spine. 3. Hold for __________ seconds. 4. Slowly return to the starting position. Repeat __________ times. Complete this exercise __________ times a day. Exercise C: Flexion, seated  1. Sit in a stable chair so your left / right forearm can rest on a flat surface. Your elbow should rest at a height that keeps your upper arm next to your body. 2. Keeping your shoulder relaxed, lean forward at the waist and let your hand slide forward. Stop when you feel a stretch in your shoulder, or when you reach the angle that is recommended by your health care provider. 3. Hold for __________ seconds. 4. Slowly return to the starting position. Repeat __________ times. Complete this exercise __________ times a day. Strengthening exercises These exercises build strength and endurance in your shoulder. Endurance is the ability to use your muscles for a long time, even after they get tired. Exercise D: Shoulder abduction, isometric  1. Stand or sit about 4-6 inches (10-15 cm) away from a wall with your right/left side facing the wall. 2. Bend your left / right elbow and gently press your elbow against the wall as if you are trying to move your arm out to your side. Gradually increase the pressure until you are pressing as hard as  you can without shrugging your shoulder. 3. Hold for __________ seconds. 4. Slowly release the tension and relax your muscles completely before you repeat the exercise. Repeat __________ times. Complete this exercise __________ times a day. Exercise E: Internal rotation, isometric  1. Stand or sit in a doorway, facing the door frame. 2. Bend your left / right elbow and place the palm of your hand against the door frame. Only your palm should be touching the frame. Keep your upper arm at your side. 3. Gently press your hand against the door frame, as if you  are trying to push your arm toward your abdomen. ? Avoid shrugging your shoulder while you press your hand into the door frame. Keep your shoulder blade tucked down toward the middle of your back. 4. Hold for __________ seconds. 5. Slowly release the tension, and relax your muscles completely before you repeat the exercise. Repeat __________ times. Complete this exercise __________ times a day. Exercise F: External rotation, isometric  1. Stand or sit in a doorway, facing the door frame. 2. Bend your left / right elbow and place the back of your wrist against the door frame. Only the back of your wrist should be touching the frame. Keep your upper arm at your side. 3. Gently press your wrist against the door frame, as if you are trying to push your arm away from your abdomen. ? Avoid shrugging your shoulder while you press your wrist into the door frame. Keep your shoulder blade tucked down toward the middle of your back. 4. Hold for __________ seconds. 5. Slowly release the tension, and relax your muscles completely before you repeat the exercise. Repeat __________ times. Complete this exercise __________ times a day. Exercise G: Internal rotation  1. Sit in a stable chair without armrests, or stand. 2. Secure an exercise band at elbow height at your left / right side. 3. Place a soft object, such as a folded towel or a small pillow,  between your left / right upper arm and your body to move your elbow a few inches (about 10 cm) away from your side. 4. Hold the end of the exercise band so it stretches. 5. Keeping your elbow pressed against the soft object, move your forearm in, toward your abdomen. Keep your body steady so the movement is only coming from your shoulder. 6. Hold for __________ seconds. 7. Slowly return to the starting position. Repeat __________ times. Complete this exercise __________ times a day. Exercise H: External rotation  1. Sit in a stable chair without armrests, or stand. 2. Secure an exercise band at elbow height on your left / right side. 3. Place a soft object, such as a folded towel or a small pillow, between your left / right upper arm and your body to move your elbow a few inches (about 10 cm) away from your side. 4. Hold the end of the exercise band so it stretches. 5. Keeping your elbow pressed against the soft object, move your forearm out, away from your abdomen. Keep your body steady so the movement is only coming from your shoulder. 6. Hold for __________ seconds. 7. Slowly return to the starting position. Repeat __________ times. Complete this exercise __________ times a day. This information is not intended to replace advice given to you by your health care provider. Make sure you discuss any questions you have with your health care provider. Document Released: 01/18/2005 Document Revised: 12/04/2016 Document Reviewed: 07/03/2015 Elsevier Interactive Patient Education  2019 [employer]

## 2018-12-26 DIAGNOSIS — M9901 Segmental and somatic dysfunction of cervical region: Secondary | ICD-10-CM | POA: Diagnosis not present

## 2018-12-26 DIAGNOSIS — M9903 Segmental and somatic dysfunction of lumbar region: Secondary | ICD-10-CM | POA: Diagnosis not present

## 2018-12-26 DIAGNOSIS — M62830 Muscle spasm of back: Secondary | ICD-10-CM | POA: Diagnosis not present

## 2018-12-26 DIAGNOSIS — M9902 Segmental and somatic dysfunction of thoracic region: Secondary | ICD-10-CM | POA: Diagnosis not present

## 2019-01-05 DIAGNOSIS — J069 Acute upper respiratory infection, unspecified: Secondary | ICD-10-CM | POA: Diagnosis not present

## 2019-01-05 DIAGNOSIS — R05 Cough: Secondary | ICD-10-CM | POA: Diagnosis not present

## 2019-01-05 DIAGNOSIS — Z03818 Encounter for observation for suspected exposure to other biological agents ruled out: Secondary | ICD-10-CM | POA: Diagnosis not present

## 2019-01-07 DIAGNOSIS — Z03818 Encounter for observation for suspected exposure to other biological agents ruled out: Secondary | ICD-10-CM | POA: Diagnosis not present

## 2019-01-07 DIAGNOSIS — J029 Acute pharyngitis, unspecified: Secondary | ICD-10-CM | POA: Diagnosis not present

## 2019-01-16 DIAGNOSIS — M62830 Muscle spasm of back: Secondary | ICD-10-CM | POA: Diagnosis not present

## 2019-01-16 DIAGNOSIS — M9901 Segmental and somatic dysfunction of cervical region: Secondary | ICD-10-CM | POA: Diagnosis not present

## 2019-01-16 DIAGNOSIS — M9902 Segmental and somatic dysfunction of thoracic region: Secondary | ICD-10-CM | POA: Diagnosis not present

## 2019-01-16 DIAGNOSIS — M9903 Segmental and somatic dysfunction of lumbar region: Secondary | ICD-10-CM | POA: Diagnosis not present

## 2019-11-09 IMAGING — DX LEFT FOOT - COMPLETE 3+ VIEW
3 series · 3 of 3 positions shown · non-contrast
Comparison: None.

CLINICAL DATA: Ran over by horse today now with left lateral
metatarsal pain.

EXAM:
LEFT FOOT - COMPLETE 3+ VIEW

[foot ap]
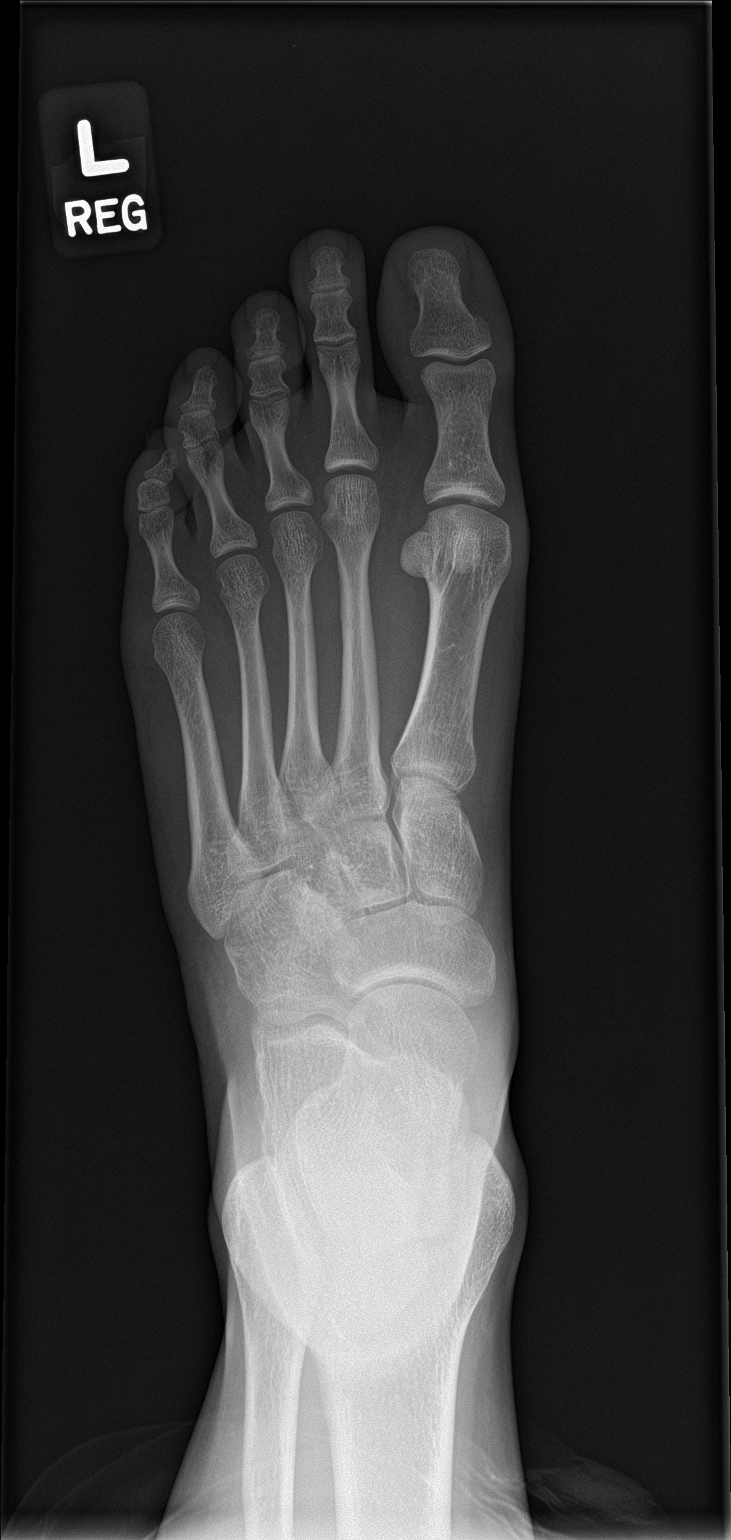

[foot obl]
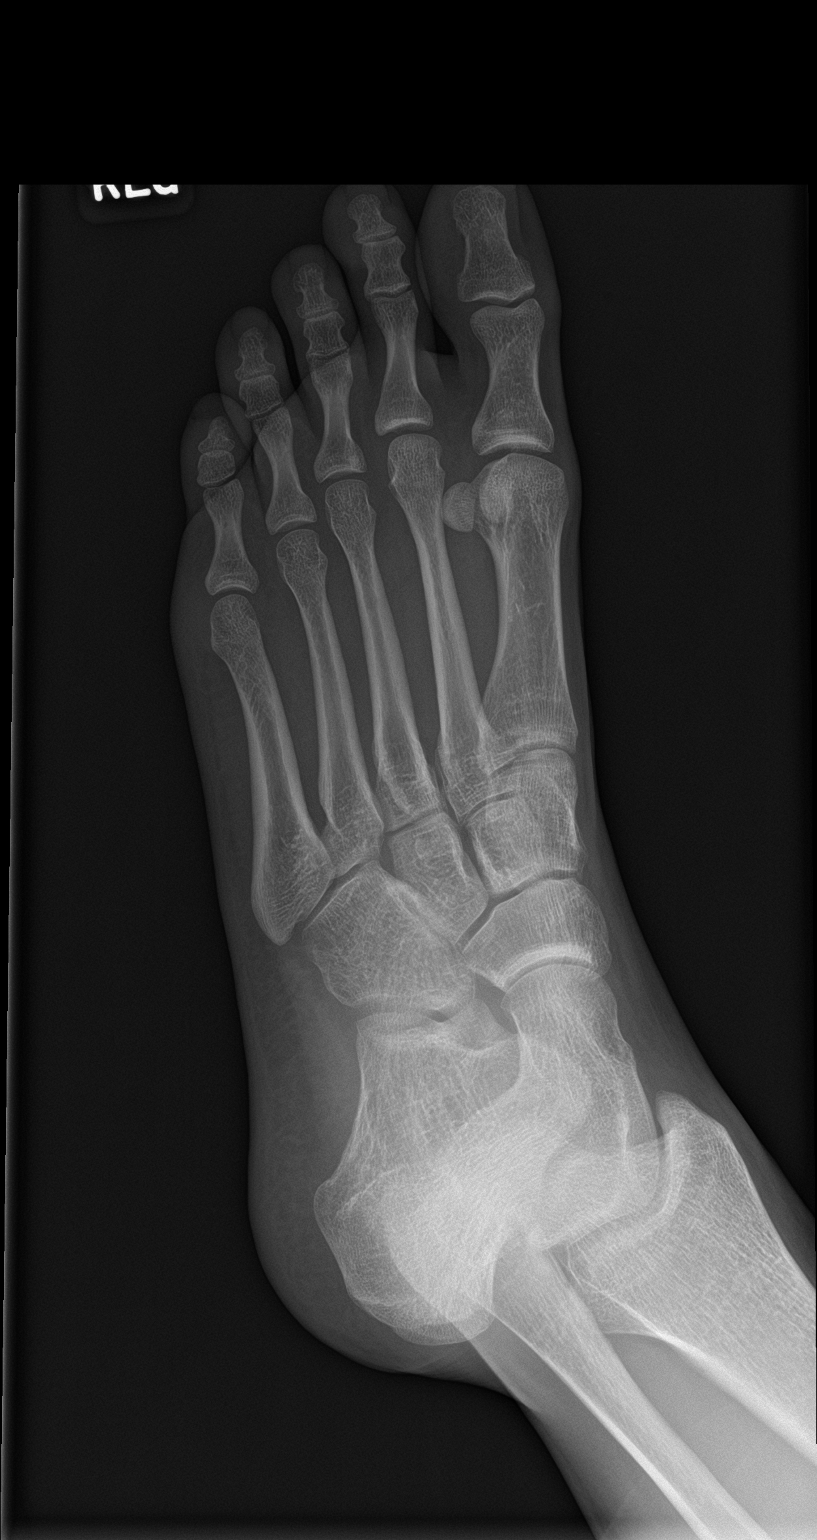

[foot lat]
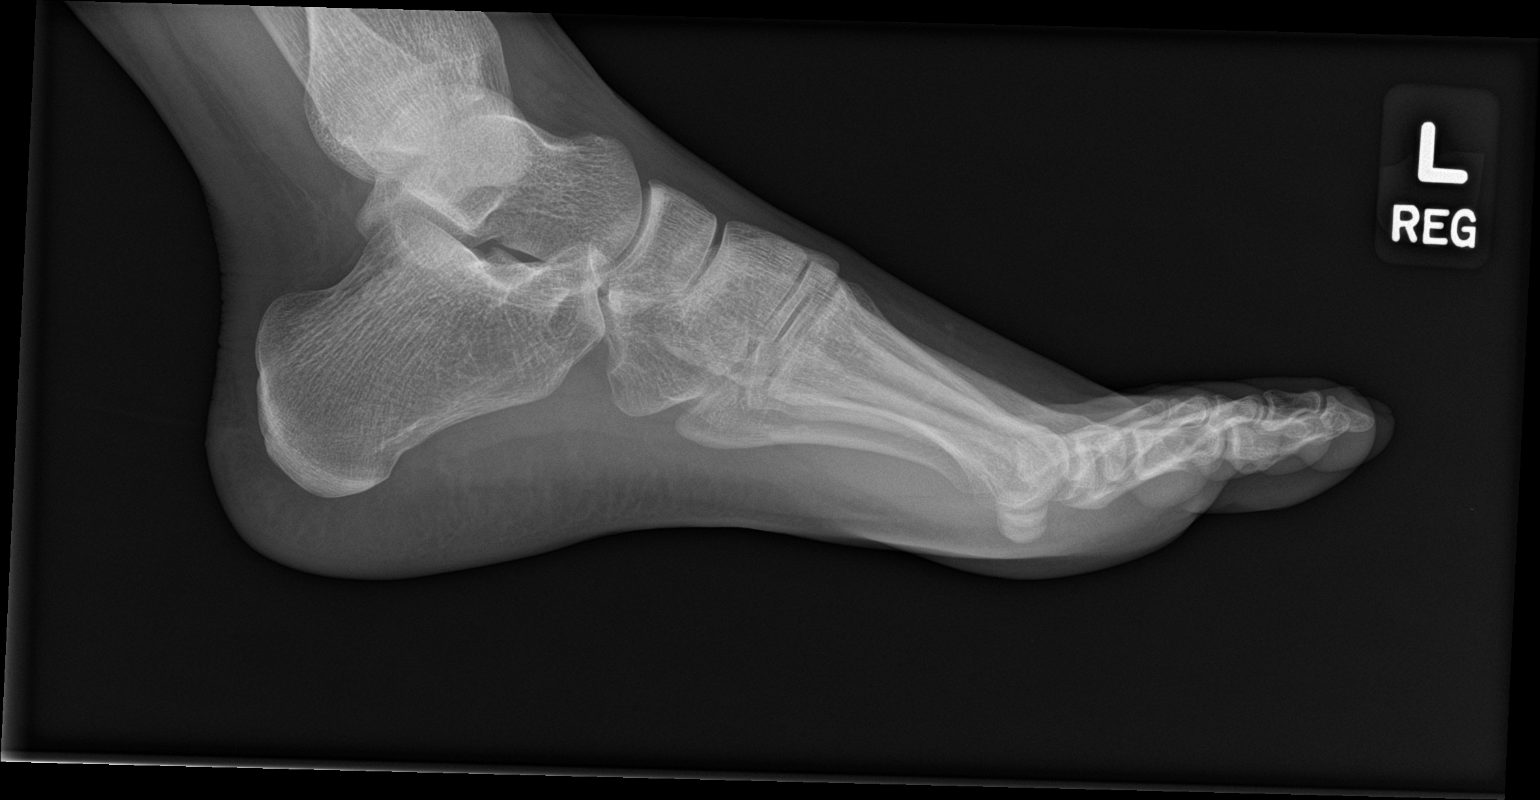

[3 of 3 positions shown; findings below may reference images not displayed]

FINDINGS: No definite fracture or dislocation. Joint spaces appear preserved.
No significant hallux valgus deformity. No erosions. No plantar
calcaneal spur. Regional soft tissues appear normal. No radiopaque
foreign body.
IMPRESSION: No fracture or radiopaque foreign body.
